# Patient Record
Sex: Male | Born: 1950 | Race: White | Hispanic: No | Marital: Married | Smoking: Former smoker
Health system: Southern US, Community
[De-identification: ages and names within clinical notes are randomized; demographics above are authoritative.]

## PROBLEM LIST (undated history)

## (undated) DIAGNOSIS — F341 Dysthymic disorder: Secondary | ICD-10-CM

## (undated) DIAGNOSIS — F909 Attention-deficit hyperactivity disorder, unspecified type: Secondary | ICD-10-CM

## (undated) DIAGNOSIS — Z8781 Personal history of (healed) traumatic fracture: Secondary | ICD-10-CM

## (undated) DIAGNOSIS — Z1211 Encounter for screening for malignant neoplasm of colon: Secondary | ICD-10-CM

## (undated) DIAGNOSIS — Z87820 Personal history of traumatic brain injury: Secondary | ICD-10-CM

## (undated) DIAGNOSIS — I498 Other specified cardiac arrhythmias: Secondary | ICD-10-CM

## (undated) DIAGNOSIS — M7120 Synovial cyst of popliteal space [Baker], unspecified knee: Secondary | ICD-10-CM

## (undated) DIAGNOSIS — E291 Testicular hypofunction: Secondary | ICD-10-CM

## (undated) DIAGNOSIS — H269 Unspecified cataract: Secondary | ICD-10-CM

## (undated) DIAGNOSIS — Z87891 Personal history of nicotine dependence: Secondary | ICD-10-CM

## (undated) DIAGNOSIS — R7303 Prediabetes: Secondary | ICD-10-CM

## (undated) DIAGNOSIS — M19011 Primary osteoarthritis, right shoulder: Secondary | ICD-10-CM

## (undated) DIAGNOSIS — G8929 Other chronic pain: Secondary | ICD-10-CM

## (undated) DIAGNOSIS — Z9289 Personal history of other medical treatment: Secondary | ICD-10-CM

## (undated) DIAGNOSIS — S92301A Fracture of unspecified metatarsal bone(s), right foot, initial encounter for closed fracture: Secondary | ICD-10-CM

## (undated) HISTORY — DX: Unspecified cataract: H26.9

## (undated) HISTORY — DX: Personal history of other medical treatment: Z92.89

## (undated) HISTORY — DX: Encounter for screening for malignant neoplasm of colon: Z12.11

## (undated) HISTORY — DX: Personal history of traumatic brain injury: Z87.820

## (undated) HISTORY — DX: Prediabetes: R73.03

## (undated) HISTORY — DX: Testicular hypofunction: E29.1

## (undated) HISTORY — DX: Attention-deficit hyperactivity disorder, unspecified type: F90.9

## (undated) HISTORY — DX: Other specified cardiac arrhythmias: I49.8

## (undated) HISTORY — DX: Personal history of nicotine dependence: Z87.891

## (undated) HISTORY — DX: Synovial cyst of popliteal space (Baker), unspecified knee: M71.20

## (undated) HISTORY — DX: Dysthymic disorder: F34.1

## (undated) HISTORY — DX: Fracture of unspecified metatarsal bone(s), right foot, initial encounter for closed fracture: S92.301A

---
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
## 2000-12-04 ENCOUNTER — Encounter: Payer: Self-pay | Admitting: Infectious Diseases

## 2000-12-04 ENCOUNTER — Ambulatory Visit (HOSPITAL_COMMUNITY): Admission: RE | Admit: 2000-12-04 | Discharge: 2000-12-04 | Payer: Self-pay | Admitting: Infectious Diseases

## 2002-02-04 ENCOUNTER — Ambulatory Visit (HOSPITAL_COMMUNITY): Admission: RE | Admit: 2002-02-04 | Discharge: 2002-02-04 | Payer: Self-pay | Admitting: General Surgery

## 2002-06-04 ENCOUNTER — Ambulatory Visit (HOSPITAL_COMMUNITY): Admission: RE | Admit: 2002-06-04 | Discharge: 2002-06-04 | Payer: Self-pay | Admitting: General Surgery

## 2003-12-14 ENCOUNTER — Ambulatory Visit (HOSPITAL_COMMUNITY): Admission: RE | Admit: 2003-12-14 | Discharge: 2003-12-14 | Payer: Self-pay | Admitting: General Surgery

## 2006-09-16 ENCOUNTER — Emergency Department (HOSPITAL_COMMUNITY): Admission: EM | Admit: 2006-09-16 | Discharge: 2006-09-16 | Payer: Self-pay | Admitting: Emergency Medicine

## 2008-04-23 HISTORY — PX: SEPTOPLASTY: SUR1290

## 2014-11-15 ENCOUNTER — Encounter: Payer: Self-pay | Admitting: Family Medicine

## 2014-11-15 ENCOUNTER — Ambulatory Visit (INDEPENDENT_AMBULATORY_CARE_PROVIDER_SITE_OTHER): Payer: Medicare Other | Admitting: Family Medicine

## 2014-11-15 VITALS — BP 130/85 | HR 71 | Temp 97.9°F | Resp 16 | Ht 69.5 in | Wt 165.0 lb

## 2014-11-15 DIAGNOSIS — F900 Attention-deficit hyperactivity disorder, predominantly inattentive type: Secondary | ICD-10-CM | POA: Diagnosis not present

## 2014-11-15 DIAGNOSIS — F909 Attention-deficit hyperactivity disorder, unspecified type: Secondary | ICD-10-CM

## 2014-11-15 MED ORDER — MUPIROCIN 2 % EX OINT: 1.0000 "application " | TOPICAL_OINTMENT | Freq: Three times a day (TID) | CUTANEOUS | Status: DC

## 2014-11-15 MED ORDER — AMPHETAMINE-DEXTROAMPHETAMINE 10 MG PO TABS: ORAL_TABLET | ORAL | Status: DC

## 2014-11-15 NOTE — Progress Notes (Signed)
Office Note 11/15/2014  CC:  Chief Complaint  Patient presents with  . Establish Care    needs refill on medication   HPI:  Clinton Adams is a 64 y.o. White male who is here to establish care. Patient's most recent primary MD:  PA at a [facility]" on [address] in [place]. Dr. Creta Levin at [facility] in [place]. Old records were not reviewed prior to or during today's visit.  Hx of adult ADD, dx'd approx 10 yrs ago but feels like he had classic sx's in HS. Ritalin gave him ANGER response. Adderall XR was then tried initially, then he was switched to IR and then titrated dose down to 10mg  : 1-2 IR tabs gets him through the day.  It helps him with focus, attention, eases tendency to get easily frustrated, makes his mood brighter in spite of bad days sometimes.    Past Medical History  Diagnosis Date  . Adult ADHD     Past Surgical History  Procedure Laterality Date  . Septoplasty  2010    deviated septum, per pt    Family History  Problem Relation Age of Onset  . Hypertension Mother   . Lung cancer Father     History   Social History  . Marital Status: Married    Spouse Name: N/A  . Number of Children: N/A  . Years of Education: N/A   Occupational History  . Not on file.   Social History Main Topics  . Smoking status: Former Smoker -- 1.00 packs/day for 30 years    Types: Cigarettes    Quit date: 04/23/1994  . Smokeless tobacco: Never Used  . Alcohol Use: Yes     Comment: daily  . Drug Use: No  . Sexual Activity: Not on file   Other Topics Concern  . Not on file   Social History Narrative   Married, one daughter.  No grandchilden.   Occup: Retired PA, CV surg/trauma surg/ortho, some FP.   Owns [employer] in [place], [place].   Former smoker; 30 pack-yr hx, quit age 23.  Alc:  1-2 hard liquor drinks a day.   Exercise: bicycle   Meds: adderall 10mg  IR, 1 tab bid  Not on File  ROS Review of Systems  Constitutional: Negative for fever and  fatigue.  HENT: Negative for congestion and sore throat.   Eyes: Negative for visual disturbance.  Respiratory: Negative for cough.   Cardiovascular: Negative for chest pain.  Gastrointestinal: Negative for nausea and abdominal pain.  Genitourinary: Negative for dysuria.  Musculoskeletal: Negative for back pain and joint swelling.  Skin: Negative for rash.  Neurological: Negative for weakness and headaches.  Hematological: Negative for adenopathy.    PE; Blood pressure 130/85, pulse 71, temperature 97.9 F (36.6 C), temperature source Oral, resp. rate 16, height 5' 9.5" (1.765 m), weight 165 lb (74.844 kg), SpO2 98 %. Wt Readings from Last 2 Encounters:  11/15/14 165 lb (74.844 kg)    Gen: alert, oriented x 4, affect pleasant.  Lucid thinking and conversation noted. HEENT: PERRLA, EOMI.   Neck: no LAD, mass, or thyromegaly. CV: RRR, no m/r/g LUNGS: CTA bilat, nonlabored. NEURO: no tremor or tics noted on observation.  Coordination intact. CN 2-12 grossly intact bilaterally, strength 5/5 in all extremeties.  No ataxia.  Pertinent labs:  none  ASSESSMENT AND PLAN:   New pt; no old records to obtain.  Adult ADHD.  The current medical regimen is effective;  continue present plan  and medications. I printed rx's for adderall IR , 1 bid, #60 today for this month, August, and September 2016.  Appropriate fill on/after date was noted on each rx.   At next f/u we'll have him sign standard drug contract as per protocol, possibly check UDS.  An After Visit Summary was printed and given to the patient.  Return in about 4 months (around 03/18/2015) for f/u adult add.

## 2014-11-15 NOTE — Progress Notes (Signed)
Pre visit review using our clinic review tool, if applicable. No additional management support is needed unless otherwise documented below in the visit note. 

## 2015-03-22 ENCOUNTER — Ambulatory Visit (INDEPENDENT_AMBULATORY_CARE_PROVIDER_SITE_OTHER): Payer: Medicare Other | Admitting: Family Medicine

## 2015-03-22 ENCOUNTER — Encounter: Payer: Self-pay | Admitting: Family Medicine

## 2015-03-22 VITALS — BP 131/86 | HR 70 | Temp 98.4°F | Resp 16 | Ht 69.5 in | Wt 167.0 lb

## 2015-03-22 DIAGNOSIS — R7611 Nonspecific reaction to tuberculin skin test without active tuberculosis: Secondary | ICD-10-CM | POA: Diagnosis not present

## 2015-03-22 DIAGNOSIS — F900 Attention-deficit hyperactivity disorder, predominantly inattentive type: Secondary | ICD-10-CM

## 2015-03-22 DIAGNOSIS — F909 Attention-deficit hyperactivity disorder, unspecified type: Secondary | ICD-10-CM

## 2015-03-22 MED ORDER — AMPHETAMINE-DEXTROAMPHETAMINE 10 MG PO TABS: ORAL_TABLET | ORAL | Status: DC

## 2015-03-22 NOTE — Patient Instructions (Signed)
Soak your toe in warm water with 1 capful of listerine, 4 capfuls of hydrogen peroxide, and 5 pumps of antibacterial soap: 20 min soak at least once a day.

## 2015-03-22 NOTE — Progress Notes (Signed)
Pre visit review using our clinic review tool, if applicable. No additional management support is needed unless otherwise documented below in the visit note. 

## 2015-03-22 NOTE — Progress Notes (Signed)
OFFICE VISIT  04/02/2015   CC:  Chief Complaint  Patient presents with  . Follow-up    Pt is not fasting.    HPI:    Patient is a 64 y.o. Caucasian male who presents for 4 mo f/u adult ADHD. Adderall helping with focus/concentration, no side effects, says he takes it daily.  States in 1974 he had an equivocal TB skin test and CXR at that time was normal and he was rx'd INH but admits he was partially noncompliant.  His routine after that was to get annual CXR but he hasn't had one in about 4 yrs. No repeat of TST has been done.  He has never had one of the quantiferon blood screening TB tests done. Denies cough, hemoptysis, chest pain, SOB, fevers, night sweats, or abnormal wt loss.  Past Medical History  Diagnosis Date  . Adult ADHD   . Former smoker   . 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  With his positive skin test he was treated for latent TB with INH but admits he wasn't fully compliant with the med (CXR pending as of 03/24/15).    Past Surgical History  Procedure Laterality Date  . Septoplasty  2010    deviated septum, per pt    Outpatient Prescriptions Prior to Visit  Medication Sig Dispense Refill  . amphetamine-dextroamphetamine (ADDERALL) 10 MG tablet 1 tab bid 60 tablet 0  . mupirocin ointment (BACTROBAN) 2 % Apply 1 application topically 3 (three) times daily. (Patient not taking: Reported on 03/22/2015) 15 g 2   No facility-administered medications prior to visit.    No Known Allergies  ROS As per HPI  PE: Blood pressure 131/86, pulse 70, temperature 98.4 F (36.9 C), temperature source Oral, resp. rate 16, height 5' 9.5" (1.765 m), weight 167 lb (75.751 kg), SpO2 97 %. Wt Readings from Last 2 Encounters:  03/22/15 167 lb (75.751 kg)  11/15/14 165 lb (74.844 kg)    Gen: alert, oriented x 4, affect pleasant.  Lucid thinking and conversation noted. HEENT: PERRLA, EOMI.   Neck: no LAD, mass, or thyromegaly. CV:  RRR, no m/r/g LUNGS: CTA bilat, nonlabored. NEURO: no tremor or tics noted on observation.  Coordination intact. CN 2-12 grossly intact bilaterally, strength 5/5 in all extremeties.  No ataxia.  LABS:  None today  IMPRESSION AND PLAN:  1) Adult ADHD; The current medical regimen is effective;  continue present plan and medications. I printed rx's for adderall 10mg  1 bid, #60 today for this month, Dec 2016, and Jan 2017.  Appropriate fill on/after date was noted on each rx.  2) Hx of latent TB; treated with INH but pt not fully compliant with treatment. Since pt claims his TB skin test at that time was "a little iffy", I'll do a quantiferon gold blood test today to screen him, will also do CXR today.  He declined flu and pneumonia vaccines today.  An After Visit Summary was printed and given to the patient.  FOLLOW UP: Return in about 4 months (around 07/20/2015) for Adult ADD.

## 2015-03-24 ENCOUNTER — Encounter: Payer: Self-pay | Admitting: Family Medicine

## 2015-03-24 LAB — QUANTIFERON TB GOLD ASSAY (BLOOD)
Interferon Gamma Release Assay: POSITIVE — AB
Mitogen value: >10 IU/mL
Quantiferon Nil Value: 0.02 IU/mL
Quantiferon Tb Ag Minus Nil Value: 1.51 IU/mL
TB Ag value: 1.53 IU/mL

## 2015-05-10 ENCOUNTER — Ambulatory Visit (HOSPITAL_BASED_OUTPATIENT_CLINIC_OR_DEPARTMENT_OTHER)
Admission: RE | Admit: 2015-05-10 | Discharge: 2015-05-10 | Disposition: A | Discharge status: Home / Self Care | Payer: Medicare Other | Source: Ambulatory Visit | Attending: Family Medicine | Admitting: Family Medicine

## 2015-05-10 DIAGNOSIS — R7611 Nonspecific reaction to tuberculin skin test without active tuberculosis: Secondary | ICD-10-CM | POA: Diagnosis not present

## 2015-05-10 DIAGNOSIS — R918 Other nonspecific abnormal finding of lung field: Secondary | ICD-10-CM | POA: Diagnosis not present

## 2015-05-10 HISTORY — DX: Personal history of (healed) traumatic fracture: Z87.81

## 2015-05-11 ENCOUNTER — Encounter (HOSPITAL_BASED_OUTPATIENT_CLINIC_OR_DEPARTMENT_OTHER): Payer: Self-pay

## 2015-05-11 ENCOUNTER — Other Ambulatory Visit: Payer: Self-pay | Admitting: Family Medicine

## 2015-05-11 DIAGNOSIS — S2232XA Fracture of one rib, left side, initial encounter for closed fracture: Secondary | ICD-10-CM

## 2015-05-11 DIAGNOSIS — J9811 Atelectasis: Secondary | ICD-10-CM

## 2015-08-04 ENCOUNTER — Encounter: Payer: Self-pay | Admitting: Family Medicine

## 2015-08-04 ENCOUNTER — Ambulatory Visit (INDEPENDENT_AMBULATORY_CARE_PROVIDER_SITE_OTHER): Payer: Medicare Other | Admitting: Family Medicine

## 2015-08-04 VITALS — BP 123/86 | HR 71 | Temp 97.8°F | Resp 16 | Ht 69.5 in | Wt 167.5 lb

## 2015-08-04 DIAGNOSIS — F900 Attention-deficit hyperactivity disorder, predominantly inattentive type: Secondary | ICD-10-CM

## 2015-08-04 DIAGNOSIS — F909 Attention-deficit hyperactivity disorder, unspecified type: Secondary | ICD-10-CM

## 2015-08-04 MED ORDER — AMPHETAMINE-DEXTROAMPHETAMINE 10 MG PO TABS: ORAL_TABLET | ORAL | Status: DC

## 2015-08-04 NOTE — Progress Notes (Signed)
OFFICE VISIT  08/04/2015   CC:  Chief Complaint  Patient presents with  . Follow-up    Pt is not fasting.    HPI:    Patient is a 65 y.o. Caucasian male who presents for f/u adult ADHD. Doing fine on adderall 10mg  bid.  No side effects. 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  Past Medical History  Diagnosis Date  . Adult ADHD   . Former smoker   . 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  With his positive skin test he was treated for latent TB with INH but admits he wasn't fully compliant with the med (CXR pending as of 03/24/15).  Marland Kitchen. History of rib fracture     Multiple on L side (remote past + 04/2015)    Past Surgical History  Procedure Laterality Date  . Septoplasty  2010    deviated septum, per pt    Outpatient Prescriptions Prior to Visit  Medication Sig Dispense Refill  . amphetamine-dextroamphetamine (ADDERALL) 10 MG tablet 1 tab bid 60 tablet 0   No facility-administered medications prior to visit.    No Known Allergies  ROS As per HPI  PE: Blood pressure 123/86, pulse 71, temperature 97.8 F (36.6 C), temperature source Oral, resp. rate 16, height 5' 9.5" (1.765 m), weight 167 lb 8 oz (75.978 kg), SpO2 96 %. Wt Readings from Last 2 Encounters:  08/04/15 167 lb 8 oz (75.978 kg)  03/22/15 167 lb (75.751 kg)    Gen: alert, oriented x 4, affect pleasant.  Lucid thinking and conversation noted. HEENT: PERRLA, EOMI.   Neck: no LAD, mass, or thyromegaly. CV: RRR, no m/r/g LUNGS: CTA bilat, nonlabored. NEURO: no tremor or tics noted on observation.  Coordination intact. CN 2-12 grossly intact bilaterally, strength 5/5 in all extremeties.  No ataxia.   LABS:  none  IMPRESSION AND PLAN:  Adult ADHD; The current medical regimen is effective;  continue present plan and medications. I printed rx's for adderall 10mg  bid, #60 today for this month, May 2017, and June 2017.  Appropriate fill on/after date was noted on each  rx.  We agreed to a 6 mo f/u interval this time---so he'll call for 3 more adderall rx's in 3 months. After that (in 6 mo), he'll return for fasting CPE.  An After Visit Summary was printed and given to the patient.  FOLLOW UP: Return in about 6 months (around 02/03/2016) for annual CPE (fasting).  Signed:  Santiago BumpersPhil Avanelle Pixley, MD           08/04/2015

## 2015-08-04 NOTE — Progress Notes (Signed)
Pre visit review using our clinic review tool, if applicable. No additional management support is needed unless otherwise documented below in the visit note. 

## 2015-08-15 ENCOUNTER — Telehealth: Payer: Self-pay | Admitting: Family Medicine

## 2015-08-15 MED ORDER — MEFENAMIC ACID 250 MG PO CAPS: ORAL_CAPSULE | ORAL | Status: DC

## 2015-08-15 NOTE — Telephone Encounter (Signed)
Ponstel eRx'd to pharmacy as per pt request.

## 2015-08-15 NOTE — Telephone Encounter (Signed)
Caller name:[place]  Relationship to patient:self Can be reached:[phone] Pharmacy:[facility]   Reason for call:Would like an rx for ponstel to [facility]

## 2015-08-15 NOTE — Telephone Encounter (Signed)
Pls ask what pain the patient is using this med to treat and ask if a more traditional NSAID (like diclofenac or meloxicam) would be ok with him b/c I have never used ponstel, esp without knowing the patient's kidney function first.  Let me know-thx

## 2015-08-15 NOTE — Telephone Encounter (Signed)
Please advise. Medication requested not on pt medication list.

## 2015-08-15 NOTE — Telephone Encounter (Signed)
Pt advised and voiced understanding.   

## 2015-08-15 NOTE — Telephone Encounter (Signed)
Spoke to pt and he stated that he has been using this for general aches and pains. He stated that he does not want to try diclofenac or meloxicam. He stated that he has done so reading on the ponstel and it also helps pt with alzheimers/memory problems.

## 2015-11-21 ENCOUNTER — Telehealth: Payer: Self-pay | Admitting: Family Medicine

## 2015-11-21 NOTE — Telephone Encounter (Signed)
Patient requesting refill of the following:  1. amphetamine-dextroamphetamine (ADDERALL) 10 MG tablet  2. Mefenamic Acid 250 MG CAPS  Please call when ready for pick up.   Preferred Pharmacy [facility] [id number], [place] - [address] [phone] (Phone)

## 2015-11-22 MED ORDER — AMPHETAMINE-DEXTROAMPHETAMINE 10 MG PO TABS: ORAL_TABLET | ORAL | 0 refills | Status: DC

## 2015-11-22 MED ORDER — MEFENAMIC ACID 250 MG PO CAPS: ORAL_CAPSULE | ORAL | 3 refills | Status: DC

## 2015-11-22 NOTE — Telephone Encounter (Signed)
Rx's done. He'll need a f/u visit in 3 months.-thx

## 2015-11-23 NOTE — Telephone Encounter (Signed)
Pt advised and voiced understanding.  Rx put up front for p/u.  

## 2015-12-09 DIAGNOSIS — H524 Presbyopia: Secondary | ICD-10-CM | POA: Diagnosis not present

## 2015-12-09 DIAGNOSIS — H5203 Hypermetropia, bilateral: Secondary | ICD-10-CM | POA: Diagnosis not present

## 2015-12-09 DIAGNOSIS — H52223 Regular astigmatism, bilateral: Secondary | ICD-10-CM | POA: Diagnosis not present

## 2016-03-08 ENCOUNTER — Ambulatory Visit (INDEPENDENT_AMBULATORY_CARE_PROVIDER_SITE_OTHER): Payer: Medicare Other | Admitting: Family Medicine

## 2016-03-08 ENCOUNTER — Encounter: Payer: Self-pay | Admitting: Family Medicine

## 2016-03-08 VITALS — BP 129/80 | HR 67 | Temp 97.8°F | Resp 16 | Wt 166.8 lb

## 2016-03-08 DIAGNOSIS — F909 Attention-deficit hyperactivity disorder, unspecified type: Secondary | ICD-10-CM

## 2016-03-08 DIAGNOSIS — M25519 Pain in unspecified shoulder: Secondary | ICD-10-CM

## 2016-03-08 MED ORDER — AMPHETAMINE-DEXTROAMPHETAMINE 10 MG PO TABS: ORAL_TABLET | ORAL | 0 refills | Status: DC

## 2016-03-08 MED ORDER — IBUPROFEN 800 MG PO TABS: 800.0000 mg | ORAL_TABLET | Freq: Three times a day (TID) | ORAL | 0 refills | Status: DC | PRN

## 2016-03-08 NOTE — Progress Notes (Signed)
OFFICE VISIT  03/08/2016   CC:  Chief Complaint  Patient presents with  . Follow-up    ADHD   HPI:    Patient is a 65 y.o. Caucasian male who presents for f/u adult ADHD. Adderall 10mg  bid helping well with concentration/focus/motivation.  Takes NSAIDs, takes about 90 per year, bilat shoulder pain, worse with cold weather.   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  Taking the anti-inflammatory helps at night significantly.   He wants to switch to ibuprofen 800 mg from his current NSAID.  He is not interested in imaging, injection, or PT or referral. He does not want his renal function checked today.    Past Medical History:  Diagnosis Date  . Adult ADHD   . Former smoker   . 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  With his positive skin test he was treated for latent TB with INH but admits he wasn't fully compliant with the med (CXR pending as of 03/24/15).  Marland Kitchen. History of rib fracture    Multiple on L side (remote past + 04/2015)    Past Surgical History:  Procedure Laterality Date  . SEPTOPLASTY  2010   deviated septum, per pt    Outpatient Medications Prior to Visit  Medication Sig Dispense Refill  . amphetamine-dextroamphetamine (ADDERALL) 10 MG tablet 1 tab bid 60 tablet 0  . Mefenamic Acid 250 MG CAPS 1 tab po q6h prn pain.  Do not take consecutive doses for more than 7 days.  Wait 7 days before restarting. 28 each 3   No facility-administered medications prior to visit.     No Known Allergies  ROS As per HPI  PE: Blood pressure 129/80, pulse 67, temperature 97.8 F (36.6 C), temperature source Temporal, resp. rate 16, weight 166 lb 12.8 oz (75.7 kg), SpO2 98 %. Wt Readings from Last 2 Encounters:  03/08/16 166 lb 12.8 oz (75.7 kg)  08/04/15 167 lb 8 oz (76 kg)    Gen: alert, oriented x 4, affect pleasant.  Lucid thinking and conversation noted. HEENT: PERRLA, EOMI.   Neck: no LAD, mass, or  thyromegaly. CV: RRR, no m/r/g LUNGS: CTA bilat, nonlabored. NEURO: no tremor or tics noted on observation.  Coordination intact. CN 2-12 grossly intact bilaterally, strength 5/5 in all extremeties.  No ataxia.  LABS:   NONE   IMPRESSION AND PLAN:  1) Adult ADHD; The current medical regimen is effective;  continue present plan and medications. I printed rx's for adderall 10mg  bid, #60 today for the next 3 months.  Appropriate fill on/after date was noted on each rx.  2) Chronic bilat shoulder arthralgias: takes NSAID on fairly regular basis---usually a night time dose an average of 90 days per year.  He wants to switch from his current med (mefenamic acid) to ibuprofen 800mg , so I rx'd this for him today.  We have discussed need for renal function testing and he understands and wants to defer this until next f/u in 636mo---fasting CPE at that time.  An After Visit Summary was printed and given to the patient.  FOLLOW UP: Return in about 6 months (around 09/05/2016) for annual CPE (fasting).  Signed:  Santiago BumpersPhil Syreeta Figler, MD           03/08/2016

## 2016-03-08 NOTE — Progress Notes (Signed)
Pre visit review using our clinic review tool, if applicable. No additional management support is needed unless otherwise documented below in the visit note. 

## 2016-03-23 DIAGNOSIS — Z1211 Encounter for screening for malignant neoplasm of colon: Secondary | ICD-10-CM

## 2016-03-23 HISTORY — DX: Encounter for screening for malignant neoplasm of colon: Z12.11

## 2016-03-30 ENCOUNTER — Ambulatory Visit (INDEPENDENT_AMBULATORY_CARE_PROVIDER_SITE_OTHER): Payer: Medicare Other

## 2016-03-30 VITALS — BP 138/78 | HR 77 | Ht 70.5 in | Wt 171.4 lb

## 2016-03-30 DIAGNOSIS — Z0000 Encounter for general adult medical examination without abnormal findings: Secondary | ICD-10-CM

## 2016-03-30 NOTE — Patient Instructions (Addendum)
Eat heart healthy diet (full of fruits, vegetables, whole grains, lean protein, water--limit salt, fat, and sugar intake) and increase physical activity as tolerated.  Continue doing brain stimulating activities (puzzles, reading, adult coloring books, staying active) to keep memory sharp.   Complete Cologuard.    Fall Prevention in the Home Introduction Falls can cause injuries. They can happen to people of all ages. There are many things you can do to make your home safe and to help prevent falls. What can I do on the outside of my home?  Regularly fix the edges of walkways and driveways and fix any cracks.  Remove anything that might make you trip as you walk through a door, such as a raised step or threshold.  Trim any bushes or trees on the path to your home.  Use bright outdoor lighting.  Clear any walking paths of anything that might make someone trip, such as rocks or tools.  Regularly check to see if handrails are loose or broken. Make sure that both sides of any steps have handrails.  Any raised decks and porches should have guardrails on the edges.  Have any leaves, snow, or ice cleared regularly.  Use sand or salt on walking paths during winter.  Clean up any spills in your garage right away. This includes oil or grease spills. What can I do in the bathroom?  Use night lights.  Install grab bars by the toilet and in the tub and shower. Do not use towel bars as grab bars.  Use non-skid mats or decals in the tub or shower.  If you need to sit down in the shower, use a plastic, non-slip stool.  Keep the floor dry. Clean up any water that spills on the floor as soon as it happens.  Remove soap buildup in the tub or shower regularly.  Attach bath mats securely with double-sided non-slip rug tape.  Do not have throw rugs and other things on the floor that can make you trip. What can I do in the bedroom?  Use night lights.  Make sure that you have a light by  your bed that is easy to reach.  Do not use any sheets or blankets that are too big for your bed. They should not hang down onto the floor.  Have a firm chair that has side arms. You can use this for support while you get dressed.  Do not have throw rugs and other things on the floor that can make you trip. What can I do in the kitchen?  Clean up any spills right away.  Avoid walking on wet floors.  Keep items that you use a lot in easy-to-reach places.  If you need to reach something above you, use a strong step stool that has a grab bar.  Keep electrical cords out of the way.  Do not use floor polish or wax that makes floors slippery. If you must use wax, use non-skid floor wax.  Do not have throw rugs and other things on the floor that can make you trip. What can I do with my stairs?  Do not leave any items on the stairs.  Make sure that there are handrails on both sides of the stairs and use them. Fix handrails that are broken or loose. Make sure that handrails are as long as the stairways.  Check any carpeting to make sure that it is firmly attached to the stairs. Fix any carpet that is loose or worn.  Avoid  having throw rugs at the top or bottom of the stairs. If you do have throw rugs, attach them to the floor with carpet tape.  Make sure that you have a light switch at the top of the stairs and the bottom of the stairs. If you do not have them, ask someone to add them for you. What else can I do to help prevent falls?  Wear shoes that:  Do not have high heels.  Have rubber bottoms.  Are comfortable and fit you well.  Are closed at the toe. Do not wear sandals.  If you use a stepladder:  Make sure that it is fully opened. Do not climb a closed stepladder.  Make sure that both sides of the stepladder are locked into place.  Ask someone to hold it for you, if possible.  Clearly mark and make sure that you can see:  Any grab bars or handrails.  First and  last steps.  Where the edge of each step is.  Use tools that help you move around (mobility aids) if they are needed. These include:  Canes.  Walkers.  Scooters.  Crutches.  Turn on the lights when you go into a dark area. Replace any light bulbs as soon as they burn out.  Set up your furniture so you have a clear path. Avoid moving your furniture around.  If any of your floors are uneven, fix them.  If there are any pets around you, be aware of where they are.  Review your medicines with your doctor. Some medicines can make you feel dizzy. This can increase your chance of falling. Ask your doctor what other things that you can do to help prevent falls. This information is not intended to replace advice given to you by your health care provider. Make sure you discuss any questions you have with your health care provider. Document Released: 02/03/2009 Document Revised: 09/15/2015 Document Reviewed: 05/14/2014  2017 Elsevier  Health Maintenance, Male A healthy lifestyle and preventative care can promote health and wellness.  Maintain regular health, dental, and eye exams.  Eat a healthy diet. Foods like vegetables, fruits, whole grains, low-fat dairy products, and lean protein foods contain the nutrients you need and are low in calories. Decrease your intake of foods high in solid fats, added sugars, and salt. Get information about a proper diet from your health care provider, if necessary.  Regular physical exercise is one of the most important things you can do for your health. Most adults should get at least 150 minutes of moderate-intensity exercise (any activity that increases your heart rate and causes you to sweat) each week. In addition, most adults need muscle-strengthening exercises on 2 or more days a week.   Maintain a healthy weight. The body mass index (BMI) is a screening tool to identify possible weight problems. It provides an estimate of body fat based on height  and weight. Your health care provider can find your BMI and can help you achieve or maintain a healthy weight. For males 20 years and older:  A BMI below 18.5 is considered underweight.  A BMI of 18.5 to 24.9 is normal.  A BMI of 25 to 29.9 is considered overweight.  A BMI of 30 and above is considered obese.  Maintain normal blood lipids and cholesterol by exercising and minimizing your intake of saturated fat. Eat a balanced diet with plenty of fruits and vegetables. Blood tests for lipids and cholesterol should begin at age 69 and be  repeated every 5 years. If your lipid or cholesterol levels are high, you are over age 40, or you are at high risk for heart disease, you may need your cholesterol levels checked more frequently.Ongoing high lipid and cholesterol levels should be treated with medicines if diet and exercise are not working.  If you smoke, find out from your health care provider how to quit. If you do not use tobacco, do not start.  Lung cancer screening is recommended for adults aged 55-80 years who are at high risk for developing lung cancer because of a history of smoking. A yearly low-dose CT scan of the lungs is recommended for people who have at least a 30-pack-year history of smoking and are current smokers or have quit within the past 15 years. A pack year of smoking is smoking an average of 1 pack of cigarettes a day for 1 year (for example, a 30-pack-year history of smoking could mean smoking 1 pack a day for 30 years or 2 packs a day for 15 years). Yearly screening should continue until the smoker has stopped smoking for at least 15 years. Yearly screening should be stopped for people who develop a health problem that would prevent them from having lung cancer treatment.  If you choose to drink alcohol, do not have more than 2 drinks per day. One drink is considered to be 12 oz (360 mL) of beer, 5 oz (150 mL) of wine, or 1.5 oz (45 mL) of liquor.  Avoid the use of street  drugs. Do not share needles with anyone. Ask for help if you need support or instructions about stopping the use of drugs.  High blood pressure causes heart disease and increases the risk of stroke. High blood pressure is more likely to develop in:  People who have blood pressure in the end of the normal range (100-139/85-89 mm Hg).  People who are overweight or obese.  People who are African American.  If you are 68-36 years of age, have your blood pressure checked every 3-5 years. If you are 78 years of age or older, have your blood pressure checked every year. You should have your blood pressure measured twice-once when you are at a hospital or clinic, and once when you are not at a hospital or clinic. Record the average of the two measurements. To check your blood pressure when you are not at a hospital or clinic, you can use:  An automated blood pressure machine at a pharmacy.  A home blood pressure monitor.  If you are 40-56 years old, ask your health care provider if you should take aspirin to prevent heart disease.  Diabetes screening involves taking a blood sample to check your fasting blood sugar level. This should be done once every 3 years after age 14 if you are at a normal weight and without risk factors for diabetes. Testing should be considered at a younger age or be carried out more frequently if you are overweight and have at least 1 risk factor for diabetes.  Colorectal cancer can be detected and often prevented. Most routine colorectal cancer screening begins at the age of 72 and continues through age 77. However, your health care provider may recommend screening at an earlier age if you have risk factors for colon cancer. On a yearly basis, your health care provider may provide home test kits to check for hidden blood in the stool. A small camera at the end of a tube may be used to directly  examine the colon (sigmoidoscopy or colonoscopy) to detect the earliest forms of  colorectal cancer. Talk to your health care provider about this at age 3 when routine screening begins. A direct exam of the colon should be repeated every 5-10 years through age 51, unless early forms of precancerous polyps or small growths are found.  People who are at an increased risk for hepatitis B should be screened for this virus. You are considered at high risk for hepatitis B if:  You were born in a country where hepatitis B occurs often. Talk with your health care provider about which countries are considered high risk.  Your parents were born in a high-risk country and you have not received a shot to protect against hepatitis B (hepatitis B vaccine).  You have HIV or AIDS.  You use needles to inject street drugs.  You live with, or have sex with, someone who has hepatitis B.  You are a man who has sex with other men (MSM).  You get hemodialysis treatment.  You take certain medicines for conditions like cancer, organ transplantation, and autoimmune conditions.  Hepatitis C blood testing is recommended for all people born from [birth year] through 1966 and any individual with known risk factors for hepatitis C.  Healthy men should no longer receive prostate-specific antigen (PSA) blood tests as part of routine cancer screening. Talk to your health care provider about prostate cancer screening.  Testicular cancer screening is not recommended for adolescents or adult males who have no symptoms. Screening includes self-exam, a health care provider exam, and other screening tests. Consult with your health care provider about any symptoms you have or any concerns you have about testicular cancer.  Practice safe sex. Use condoms and avoid high-risk sexual practices to reduce the spread of sexually transmitted infections (STIs).  You should be screened for STIs, including gonorrhea and chlamydia if:  You are sexually active and are younger than 24 years.  You are older than 24 years, and  your health care provider tells you that you are at risk for this type of infection.  Your sexual activity has changed since you were last screened, and you are at an increased risk for chlamydia or gonorrhea. Ask your health care provider if you are at risk.  If you are at risk of being infected with HIV, it is recommended that you take a prescription medicine daily to prevent HIV infection. This is called pre-exposure prophylaxis (PrEP). You are considered at risk if:  You are a man who has sex with other men (MSM).  You are a heterosexual man who is sexually active with multiple partners.  You take drugs by injection.  You are sexually active with a partner who has HIV.  Talk with your health care provider about whether you are at high risk of being infected with HIV. If you choose to begin PrEP, you should first be tested for HIV. You should then be tested every 3 months for as long as you are taking PrEP.  Use sunscreen. Apply sunscreen liberally and repeatedly throughout the day. You should seek shade when your shadow is shorter than you. Protect yourself by wearing long sleeves, pants, a wide-brimmed hat, and sunglasses year round whenever you are outdoors.  Tell your health care provider of new moles or changes in moles, especially if there is a change in shape or color. Also, tell your health care provider if a mole is larger than the size of a pencil eraser.  A  one-time screening for abdominal aortic aneurysm (AAA) and surgical repair of large AAAs by ultrasound is recommended for men aged 65-75 years who are current or former smokers.  Stay current with your vaccines (immunizations). This information is not intended to replace advice given to you by your health care provider. Make sure you discuss any questions you have with your health care provider. Document Released: 10/06/2007 Document Revised: 04/30/2014 Document Reviewed: 01/11/2015 Elsevier Interactive Patient Education   2017 [employer]

## 2016-03-30 NOTE — Progress Notes (Addendum)
Subjective:   Clinton Adams is a 65 y.o. male who presents for an Initial Medicare Annual Wellness Visit.  The Patient was informed that the wellness visit is to identify future health risk and educate and initiate measures that can reduce risk for increased disease through the lifespan.   Describes health as fair, good or great? 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 Enjoys reading.   Review of Systems  No ROS.  Medicare Wellness Visit.  Cardiac Risk Factors include: advanced age (>6755men, 30>65 women);male gender   Sleep patterns: no sleep issues.   Home Safety/Smoke Alarms: Smoke detectors in place Living environment; residence and Firearm Safety: 2-story house with wife, daughter (66 yo) and dog.Firearms locked away.  Seat Belt Safety/Bike Helmet: Wears seat belt   Counseling:   Eye Exam-Last exam October 2017, yearly by [facility].  Dental-Last exam > 2 years, Dr. Jon BillingsMorrison.   Male:   CCS-None. Cologuard ordered.     PSA-2 years ago, patient reports normal.     Objective:    Today's Vitals   03/30/16 1305  BP: 138/78  Pulse: 77  SpO2: 97%  Weight: 171 lb 6.4 oz (77.7 kg)  Height: 5' 10.5" (1.791 m)  PainSc: 2    Body mass index is 24.25 kg/m.  Current Medications (verified) Outpatient Encounter Prescriptions as of 03/30/2016  Medication Sig  . amphetamine-dextroamphetamine (ADDERALL) 10 MG tablet 1 tab bid  . Ascorbic Acid (VITAMIN C) 100 MG tablet Take 100 mg by mouth daily.  Marland Kitchen. b complex vitamins tablet Take 1 tablet by mouth daily.  . cholecalciferol (VITAMIN D) 1000 units tablet Take 1,000 Units by mouth daily.  Marland Kitchen. co-enzyme Q-10 30 MG capsule Take 30 mg by mouth 3 (three) times daily.  Marland Kitchen. ibuprofen (ADVIL,MOTRIN) 800 MG tablet Take 1 tablet (800 mg total) by mouth every 8 (eight) hours as needed.  Marland Kitchen. KRILL OIL OMEGA-3 PO Take by mouth.  . Nutritional Supplements (DHEA PO) Take by mouth.  . vitamin B-12 (CYANOCOBALAMIN) 100  MCG tablet Take 100 mcg by mouth daily.   No facility-administered encounter medications on file as of 03/30/2016.     Allergies (verified) Patient has no known allergies.   History: Past Medical History:  Diagnosis Date  . Adult ADHD   . Former smoker   . 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  With his positive skin test he was treated for latent TB with INH but admits he wasn't fully compliant with the med (CXR pending as of 03/24/15).  Marland Kitchen. History of rib fracture    Multiple on L side (remote past + 04/2015)   Past Surgical History:  Procedure Laterality Date  . SEPTOPLASTY  2010   deviated septum, per pt   Family History  Problem Relation Age of Onset  . Hypertension Mother   . Lung cancer Father    Social History   Occupational History  . Not on file.   Social History Main Topics  . Smoking status: Former Smoker    Packs/day: 1.00    Years: 30.00    Types: Cigarettes    Quit date: 04/23/1994  . Smokeless tobacco: Never Used  . Alcohol use Yes     Comment: daily  . Drug use: No  . Sexual activity: Not on file   Tobacco Counseling Counseling given: Not Answered   Activities of Daily Living In your present state of health, do you have any difficulty  performing the following activities: 03/30/2016  Hearing? N  Vision? N  Difficulty concentrating or making decisions? N  Walking or climbing stairs? N  Dressing or bathing? N  Doing errands, shopping? N  Preparing Food and eating ? N  Using the Toilet? N  In the past six months, have you accidently leaked urine? N  Do you have problems with loss of bowel control? N  Managing your Medications? N  Managing your Finances? N  Housekeeping or managing your Housekeeping? N  Some recent data might be hidden    Immunizations and Health Maintenance  There is no immunization history on file for this patient. Health Maintenance Due  Topic Date Due  . Hepatitis C Screening   01-25-1951  . TETANUS/TDAP  12/15/1968  . COLONOSCOPY  12/16/1999  . ZOSTAVAX  12/15/2009  . PNA vac Low Risk Adult (1 of 2 - PCV13) 12/16/2014  . INFLUENZA VACCINE  11/22/2015    Patient Care Team: Jeoffrey Massed, MD as PCP - General (Family Medicine)  Indicate any recent Medical Services you may have received from other than [facility] providers in the past year (date may be approximate).    Assessment:   This is a routine wellness examination for [name]. Physical assessment deferred to PCP.   Hearing/Vision screen Hearing Screening Comments: Able to hear conversational tones w/o difficulty. No issues reported.   Vision Screening Comments: Wears glasses to 20/20 vision.   Dietary issues and exercise activities discussed: Current Exercise Habits: Home exercise routine, Type of exercise: walking;Other - see comments (biking), Time (Minutes): 45, Frequency (Times/Week): 2, Weekly Exercise (Minutes/Week): 90, Intensity: Moderate   Diet (meal preparation, eat out, water intake, caffeinated beverages, dairy products, fruits and vegetables):  Eats at home. Drinks little throughout the day, mostly Coke. Snacks on "little debbie snack cake".   Breakfast: toast, bacon and coffee Lunch: skips (eats late breakfast) Dinner: Meats, limited vegetables     Discussed increasing water, fruit and vegetable intake.  Encouraged to increase activity.  Patient reports being too busy to go to gym and has no desire to work out at home.   Goals    . Weight (lb) < 155 lb (70.3 kg)          Lose weight with lower calore intake.       Depression Screen PHQ 2/9 Scores 03/30/2016 08/04/2015  PHQ - 2 Score 6 0  PHQ- 9 Score 7 -    Patient states Adderall helps with depression. Declines further treatment. Denies thoughts of self harm.   Fall Risk Fall Risk  03/30/2016 08/04/2015  Falls in the past year? Yes Yes  Number falls in past yr: 1 1  Injury with Fall? Yes Yes  Follow up Falls prevention  discussed -    Cognitive Function:       Ad8 score reviewed for issues:  Issues making decisions:no  Less interest in hobbies / activities:no  Repeats questions, stories (family complaining):no  Trouble using ordinary gadgets (microwave, computer, phone):no  Forgets the month or year: no  Mismanaging finances: no  Remembering appts:no  Daily problems with thinking and/or memory:no Ad8 score is=0     Screening Tests Health Maintenance  Topic Date Due  . Hepatitis C Screening  June 07, 1950  . TETANUS/TDAP  12/15/1968  . COLONOSCOPY  12/16/1999  . ZOSTAVAX  12/15/2009  . PNA vac Low Risk Adult (1 of 2 - PCV13) 12/16/2014  . INFLUENZA VACCINE  11/22/2015        Plan:  Eat heart healthy diet (full of fruits, vegetables, whole grains, lean protein, water--limit salt, fat, and sugar intake) and increase physical activity as tolerated.  Continue doing brain stimulating activities (puzzles, reading, adult coloring books, staying active) to keep memory sharp.   Complete Cologuard.    During the course of the visit Stann MainlandClancy was educated and counseled about the following appropriate screening and preventive services:   Vaccines to include Pneumoccal, Influenza, Hepatitis B, Td, Zostavax, HCV  Colorectal cancer screening  Cardiovascular disease screening  Diabetes screening  Glaucoma screening  Nutrition counseling  Prostate cancer screening   Patient Instructions (the written plan) were given to the patient.   Alysia PennaKimberly R Kashina Mecum, RN   03/30/2016     Medical screening examination/treatment/procedure(s) were performed by non-physician practitioner and as supervising physician I was immediately available for consultation/collaboration.  I agree with above assessment and plan.  Electronically Signed by: Felix Pacinienee Kuneff, DO [facility]- OR

## 2016-03-30 NOTE — Progress Notes (Signed)
Pre visit review using our clinic review tool, if applicable. No additional management support is needed unless otherwise documented below in the visit note. 

## 2016-04-01 NOTE — Progress Notes (Signed)
AWV reviewed and agree.  Signed:  Santiago BumpersPhil McGowen, MD           04/01/2016

## 2016-04-04 ENCOUNTER — Encounter: Payer: Self-pay | Admitting: Family Medicine

## 2016-04-04 DIAGNOSIS — Z1211 Encounter for screening for malignant neoplasm of colon: Secondary | ICD-10-CM | POA: Diagnosis not present

## 2016-04-04 DIAGNOSIS — Z1212 Encounter for screening for malignant neoplasm of rectum: Secondary | ICD-10-CM | POA: Diagnosis not present

## 2016-04-04 LAB — COLOGUARD

## 2016-04-12 LAB — HM COLONOSCOPY

## 2016-05-22 ENCOUNTER — Other Ambulatory Visit: Payer: Self-pay | Admitting: Family Medicine

## 2016-05-22 MED ORDER — CIPROFLOXACIN HCL 750 MG PO TABS: 750.0000 mg | ORAL_TABLET | Freq: Two times a day (BID) | ORAL | 0 refills | Status: AC

## 2016-08-27 ENCOUNTER — Encounter: Payer: Self-pay | Admitting: *Deleted

## 2016-08-27 ENCOUNTER — Ambulatory Visit: Payer: Medicare Other | Admitting: Family Medicine

## 2016-08-27 DIAGNOSIS — F909 Attention-deficit hyperactivity disorder, unspecified type: Secondary | ICD-10-CM | POA: Insufficient documentation

## 2016-08-27 NOTE — Progress Notes (Deleted)
OFFICE VISIT  08/27/2016   CC: No chief complaint on file.  HPI:    Patient is a 66 y.o. Caucasian male who presents for 6 mo f/u adult ADD. Historically he has done consistently well on adderall 10mg  bid, which is what we continued at last f/u visit.  Has hx of relatively frequent NSAID use for bilat shoulder arthralgias.  Plan was to check BMET today.  Past Medical History:  Diagnosis Date  . Adult ADHD   . Former smoker   . 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  With his positive skin test he was treated for latent TB with INH but admits he wasn't fully compliant with the med (CXR pending as of 03/24/15).  Marland Kitchen. History of rib fracture    Multiple on L side (remote past + 04/2015)    Past Surgical History:  Procedure Laterality Date  . SEPTOPLASTY  2010   deviated septum, per pt    Outpatient Medications Prior to Visit  Medication Sig Dispense Refill  . amphetamine-dextroamphetamine (ADDERALL) 10 MG tablet 1 tab bid 60 tablet 0  . Ascorbic Acid (VITAMIN C) 100 MG tablet Take 100 mg by mouth daily.    Marland Kitchen. b complex vitamins tablet Take 1 tablet by mouth daily.    . cholecalciferol (VITAMIN D) 1000 units tablet Take 1,000 Units by mouth daily.    Marland Kitchen. co-enzyme Q-10 30 MG capsule Take 30 mg by mouth 3 (three) times daily.    Marland Kitchen. ibuprofen (ADVIL,MOTRIN) 800 MG tablet Take 1 tablet (800 mg total) by mouth every 8 (eight) hours as needed. 90 tablet 0  . KRILL OIL OMEGA-3 PO Take by mouth.    . Nutritional Supplements (DHEA PO) Take by mouth.    . vitamin B-12 (CYANOCOBALAMIN) 100 MCG tablet Take 100 mcg by mouth daily.     No facility-administered medications prior to visit.     No Known Allergies  ROS As per HPI  PE: There were no vitals taken for this visit. ***  LABS:  ***  IMPRESSION AND PLAN:  No problem-specific Assessment & Plan notes found for this encounter.  ? BMET?  An After Visit Summary was printed and given to the  patient.  FOLLOW UP: No Follow-up on file.  Signed:  Santiago BumpersPhil Aki Abalos, MD           08/27/2016

## 2016-08-29 ENCOUNTER — Encounter: Payer: Self-pay | Admitting: Family Medicine

## 2016-08-29 ENCOUNTER — Ambulatory Visit (INDEPENDENT_AMBULATORY_CARE_PROVIDER_SITE_OTHER): Payer: Medicare Other | Admitting: Family Medicine

## 2016-08-29 VITALS — BP 133/81 | HR 66 | Temp 98.0°F | Resp 16 | Ht 69.5 in | Wt 168.2 lb

## 2016-08-29 DIAGNOSIS — F988 Other specified behavioral and emotional disorders with onset usually occurring in childhood and adolescence: Secondary | ICD-10-CM | POA: Diagnosis not present

## 2016-08-29 MED ORDER — AMPHETAMINE-DEXTROAMPHETAMINE 10 MG PO TABS: ORAL_TABLET | ORAL | 0 refills | Status: DC

## 2016-08-29 NOTE — Progress Notes (Signed)
OFFICE VISIT  08/29/2016   CC:  Chief Complaint  Patient presents with  . Follow-up    RCI, pt is not fasting.    HPI:    Patient is a 66 y.o. Caucasian male who presents for 6 mo f/u adult ADD. Has been stable historically on adderall IR 10mg  bid.  Doing well with concentration, focus, motivation.  No adverse side effects. Very busy with running [employer].  Past Medical History:  Diagnosis Date  . Adult ADHD   . Former smoker   . 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  With his positive skin test he was treated for latent TB with INH but admits he wasn't fully compliant with the med (CXR pending as of 03/24/15).  Marland Kitchen. History of rib fracture    Multiple on L side (remote past + 04/2015)    Past Surgical History:  Procedure Laterality Date  . SEPTOPLASTY  2010   deviated septum, per pt    Outpatient Medications Prior to Visit  Medication Sig Dispense Refill  . Ascorbic Acid (VITAMIN C) 100 MG tablet Take 100 mg by mouth daily.    . cholecalciferol (VITAMIN D) 1000 units tablet Take 1,000 Units by mouth daily.    Marland Kitchen. ibuprofen (ADVIL,MOTRIN) 800 MG tablet Take 1 tablet (800 mg total) by mouth every 8 (eight) hours as needed. 90 tablet 0  . KRILL OIL OMEGA-3 PO Take by mouth.    Marland Kitchen. amphetamine-dextroamphetamine (ADDERALL) 10 MG tablet 1 tab bid 60 tablet 0  . b complex vitamins tablet Take 1 tablet by mouth daily.    Marland Kitchen. co-enzyme Q-10 30 MG capsule Take 30 mg by mouth 3 (three) times daily.    . Nutritional Supplements (DHEA PO) Take by mouth.    . vitamin B-12 (CYANOCOBALAMIN) 100 MCG tablet Take 100 mcg by mouth daily.     No facility-administered medications prior to visit.     No Known Allergies  ROS As per HPI  PE: Blood pressure 133/81, pulse 66, temperature 98 F (36.7 C), temperature source Oral, resp. rate 16, height 5' 9.5" (1.765 m), weight 168 lb 4 oz (76.3 kg), SpO2 96 %. Wt Readings from Last 2 Encounters:  08/29/16 168 lb  4 oz (76.3 kg)  03/30/16 171 lb 6.4 oz (77.7 kg)    Gen: alert, oriented x 4, affect pleasant.  Lucid thinking and conversation noted. HEENT: PERRLA, EOMI.   Neck: no LAD, mass, or thyromegaly. CV: RRR, no m/r/g LUNGS: CTA bilat, nonlabored. NEURO: no tremor or tics noted on observation.  Coordination intact. CN 2-12 grossly intact bilaterally, strength 5/5 in all extremeties.  No ataxia.   LABS:  none  IMPRESSION AND PLAN:  Adult ADD; The current medical regimen is effective;  continue present plan and medications. I printed rx's for adderall IR 10mg , 1 bid, #60 today for this month, June 2018, and July 2018.  Appropriate fill on/after date was noted on each rx.  An After Visit Summary was printed and given to the patient.  FOLLOW UP: Return in about 6 months (around 03/01/2017) for routine chronic illness f/u. He declines a CPE visit at this time.  Signed:  Santiago BumpersPhil Uno Esau, MD           08/29/2016

## 2016-09-05 ENCOUNTER — Telehealth: Payer: Self-pay | Admitting: Family Medicine

## 2016-09-05 ENCOUNTER — Encounter: Payer: Self-pay | Admitting: Family Medicine

## 2016-09-05 NOTE — Telephone Encounter (Signed)
Pls notify pt that you tracked down his Cologuard result and it was NEGATIVE.  We'll repeat this in 3 yrs.--thx

## 2016-09-06 NOTE — Telephone Encounter (Signed)
Pt advised and voiced understanding.   

## 2016-11-16 ENCOUNTER — Other Ambulatory Visit: Payer: Self-pay | Admitting: Family Medicine

## 2016-11-16 MED ORDER — AMPHETAMINE-DEXTROAMPHETAMINE 10 MG PO TABS: ORAL_TABLET | ORAL | 0 refills | Status: DC

## 2016-11-16 NOTE — Telephone Encounter (Signed)
Patient requesting refill of adderall.  Patient last seen 08/29/16.  Last RX states fill on or after 10/27/16.  Please advise.

## 2016-11-16 NOTE — Telephone Encounter (Signed)
Please advise. Thanks.  

## 2016-11-16 NOTE — Telephone Encounter (Signed)
Rx's put up front for pt to pick up. Pt advised and voiced understanding.   

## 2017-03-21 DIAGNOSIS — H2513 Age-related nuclear cataract, bilateral: Secondary | ICD-10-CM | POA: Diagnosis not present

## 2017-03-21 DIAGNOSIS — H25043 Posterior subcapsular polar age-related cataract, bilateral: Secondary | ICD-10-CM | POA: Diagnosis not present

## 2017-03-25 ENCOUNTER — Encounter: Payer: Self-pay | Admitting: *Deleted

## 2017-03-25 ENCOUNTER — Encounter: Payer: Self-pay | Admitting: Family Medicine

## 2017-03-25 ENCOUNTER — Ambulatory Visit (INDEPENDENT_AMBULATORY_CARE_PROVIDER_SITE_OTHER): Payer: Medicare Other | Admitting: Family Medicine

## 2017-03-25 VITALS — BP 96/57 | HR 79 | Temp 98.1°F | Resp 16 | Ht 69.5 in | Wt 168.5 lb

## 2017-03-25 DIAGNOSIS — F909 Attention-deficit hyperactivity disorder, unspecified type: Secondary | ICD-10-CM | POA: Diagnosis not present

## 2017-03-25 DIAGNOSIS — Z23 Encounter for immunization: Secondary | ICD-10-CM

## 2017-03-25 MED ORDER — AMPHETAMINE-DEXTROAMPHETAMINE 10 MG PO TABS: ORAL_TABLET | ORAL | 0 refills | Status: DC

## 2017-03-25 NOTE — Progress Notes (Signed)
OFFICE VISIT  03/25/2017   CC:  Chief Complaint  Patient presents with  . Follow-up    RCI, pt is not fasting.    HPI:    Patient is a 66 y.o. Caucasian male who presents for 6 mo f/u adult ADD. Stable on adderall 10mg  bid--helps focus, concentration, motivation, mood.  Denies side effects. Takes med daily. 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    BP a little low today in office, but pt is asymptomatic.  He says a recent check of BP at home was 125/75.  ROS: no HAs, no palpitations, no dizziness, no tremors, no vision or hearing complaints.   Past Medical History:  Diagnosis Date  . Adult ADHD   . Colon cancer screening 03/2016   Cologuard NEG--repeat 03/2019.  Marland Kitchen. Former smoker   . 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  With his positive skin test he was treated for latent TB with INH but admits he wasn't fully compliant with the med (CXR pending as of 03/24/15).  Marland Kitchen. History of rib fracture    Multiple on L side (remote past + 04/2015)    Past Surgical History:  Procedure Laterality Date  . SEPTOPLASTY  2010   deviated septum, per pt    Outpatient Medications Prior to Visit  Medication Sig Dispense Refill  . ibuprofen (ADVIL,MOTRIN) 800 MG tablet Take 1 tablet (800 mg total) by mouth every 8 (eight) hours as needed. 90 tablet 0  . amphetamine-dextroamphetamine (ADDERALL) 10 MG tablet 1 tab bid 60 tablet 0  . Ascorbic Acid (VITAMIN C) 100 MG tablet Take 100 mg by mouth daily.    . cholecalciferol (VITAMIN D) 1000 units tablet Take 1,000 Units by mouth daily.    Marland Kitchen. KRILL OIL OMEGA-3 PO Take by mouth.     No facility-administered medications prior to visit.     No Known Allergies  ROS As per HPI  PE: Blood pressure (!) 96/57, pulse 79, temperature 98.1 F (36.7 C), temperature source Oral, resp. rate 16, height 5' 9.5" (1.765 m), weight 168 lb 8 oz (76.4 kg), SpO2 95 %. Wt Readings from Last 2 Encounters:  03/25/17  168 lb 8 oz (76.4 kg)  08/29/16 168 lb 4 oz (76.3 kg)    Gen: alert, oriented x 4, affect pleasant.  Lucid thinking and conversation noted. HEENT: PERRLA, EOMI.   Neck: no LAD, mass, or thyromegaly. CV: RRR, no m/r/g LUNGS: CTA bilat, nonlabored. NEURO: no tremor or tics noted on observation.  Coordination intact. CN 2-12 grossly intact bilaterally, strength 5/5 in all extremeties.  No ataxia.   LABS:    Chemistry   No results found for: NA, K, CL, CO2, BUN, CREATININE, GLU No results found for: CALCIUM, ALKPHOS, AST, ALT, BILITOT     IMPRESSION AND PLAN:  Adult ADD: The current medical regimen is effective;  continue present plan and medications. I printed rx's for adderall 10mg  bid, #60 today for this month, Jan 2019, and Feb 2019.  Appropriate fill on/after date was noted on each rx.  As per our past f/u routine, I'll see him back in office in 6 mo---he'll call in 3 mo for his NEXT 3 months's rx's. [place] online controlled substances prescribing database reviewed today.  No red flags. Controlled substance contract reviewed with pt and signed today. No UDS today.  Flu vaccine given today.  An After Visit Summary was printed and given to the patient.  FOLLOW UP: Return in  about 6 months (around 09/23/2017) for f/u Adult ADD.  Signed:  Santiago BumpersPhil Cambre Matson, MD           03/25/2017

## 2017-03-29 IMAGING — DX DG CHEST 2V
2 series · 2 of 2 positions shown · non-contrast
Comparison: None.

CLINICAL DATA: Patient with positive PPD test.

EXAM:
CHEST  2 VIEW

[chest pa]
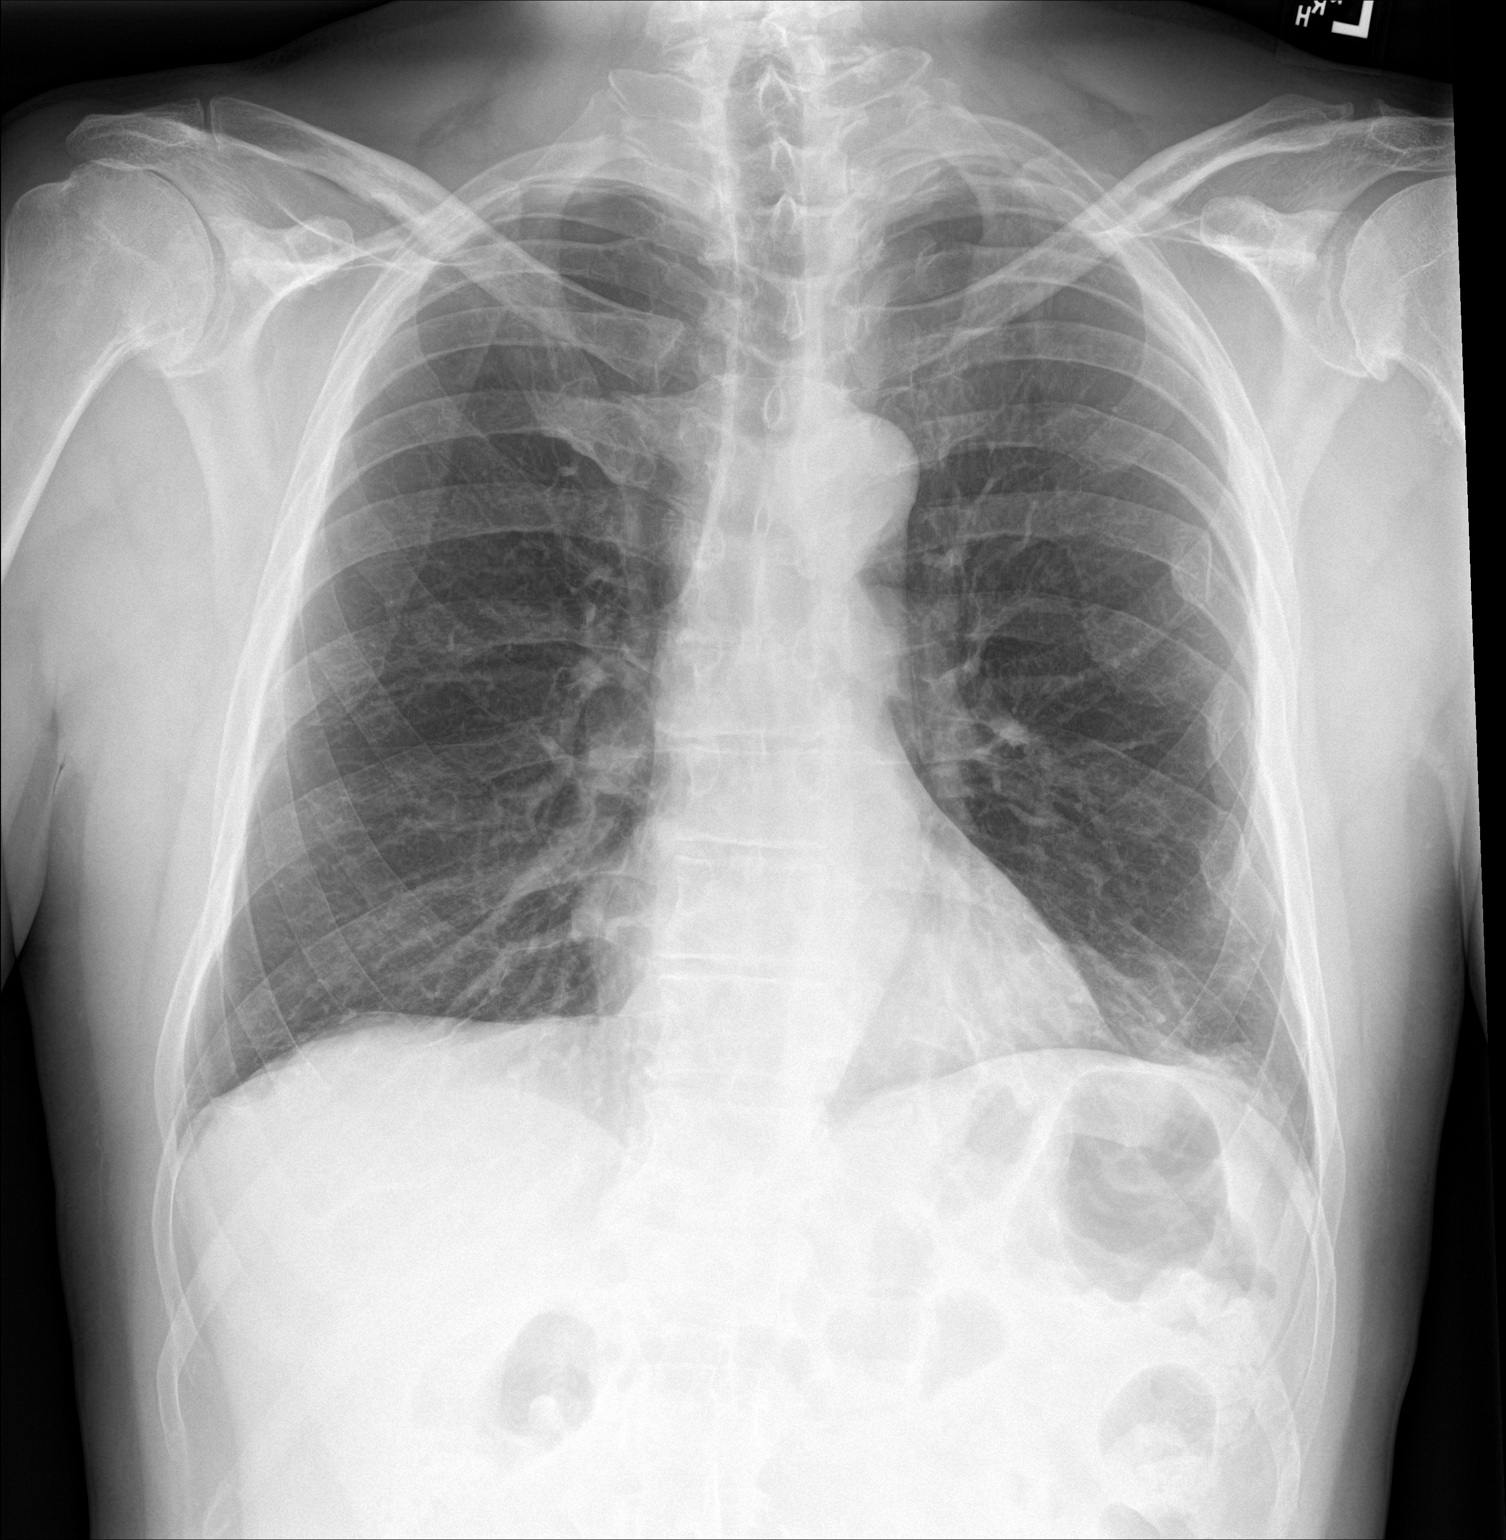

[chest lat]
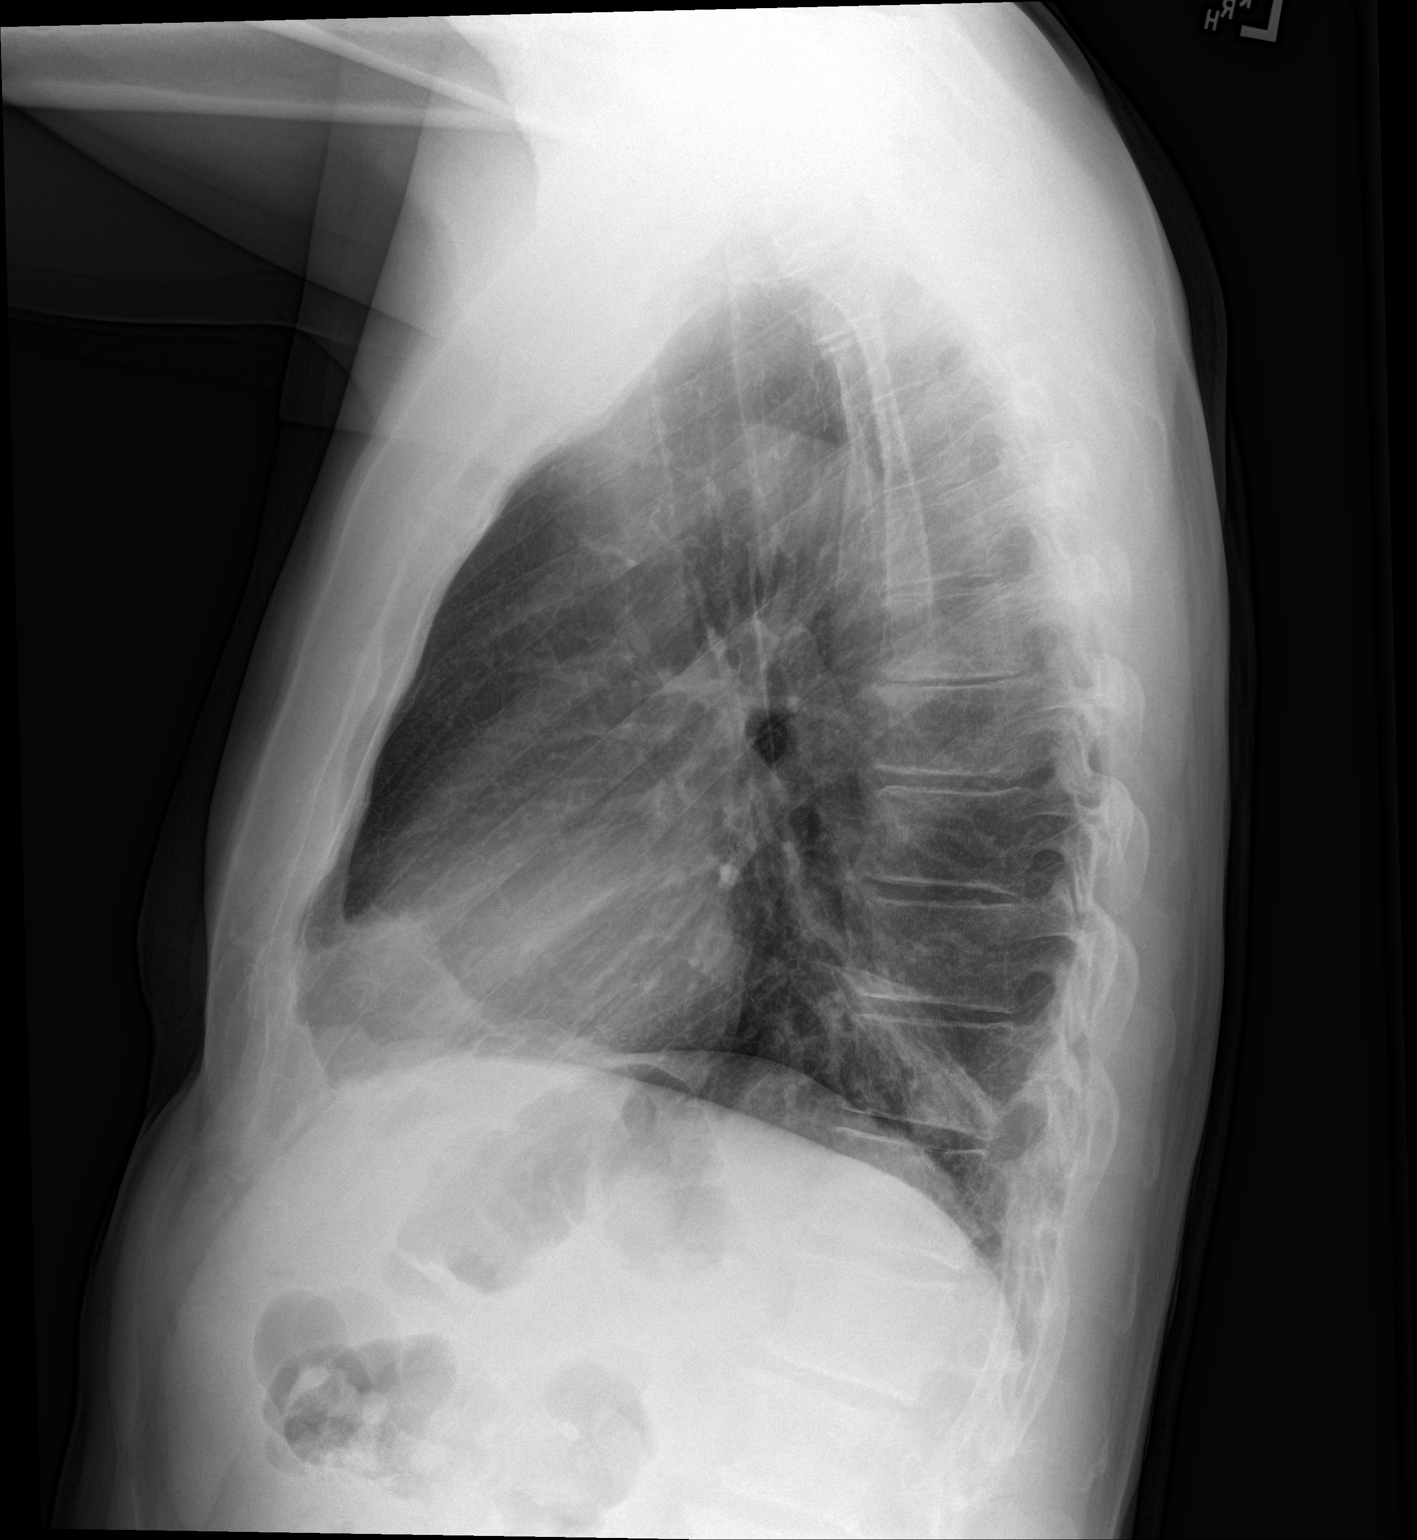

[2 of 2 positions shown; findings below may reference images not displayed]

FINDINGS: Normal cardiac and mediastinal contours. Focal consolidation within
the left lower lung. No pleural effusion or pneumothorax. Multiple
left rib fractures.
IMPRESSION: Focal consolidation within the left lower lung is nonspecific and
may represent atelectasis, scarring and or infection. Recommend
follow-up chest radiograph in 3-4 weeks to assess for interval
change and/or resolution.

## 2017-06-04 DIAGNOSIS — H25013 Cortical age-related cataract, bilateral: Secondary | ICD-10-CM | POA: Diagnosis not present

## 2017-06-04 DIAGNOSIS — H2513 Age-related nuclear cataract, bilateral: Secondary | ICD-10-CM | POA: Diagnosis not present

## 2017-06-04 DIAGNOSIS — H18413 Arcus senilis, bilateral: Secondary | ICD-10-CM | POA: Diagnosis not present

## 2017-06-04 DIAGNOSIS — H25043 Posterior subcapsular polar age-related cataract, bilateral: Secondary | ICD-10-CM | POA: Diagnosis not present

## 2017-06-04 DIAGNOSIS — H2511 Age-related nuclear cataract, right eye: Secondary | ICD-10-CM | POA: Diagnosis not present

## 2017-07-24 ENCOUNTER — Telehealth: Payer: Self-pay | Admitting: Family Medicine

## 2017-07-24 MED ORDER — MUPIROCIN 2 % EX OINT: 1.0000 "application " | TOPICAL_OINTMENT | Freq: Three times a day (TID) | CUTANEOUS | 0 refills | Status: DC

## 2017-07-24 MED ORDER — IBUPROFEN 800 MG PO TABS: 800.0000 mg | ORAL_TABLET | Freq: Three times a day (TID) | ORAL | 0 refills | Status: DC | PRN

## 2017-07-24 NOTE — Telephone Encounter (Signed)
Patient requesting refills of the following:  ibuprofen (ADVIL,MOTRIN) 800 MG tablet  Bactroban Ointment 2%  [facility]

## 2017-07-24 NOTE — Telephone Encounter (Signed)
LOV: 03/25/17 NOV: None  Please advise. Thanks.

## 2017-07-24 NOTE — Telephone Encounter (Signed)
OK, rx's sent to [facility].

## 2017-07-24 NOTE — Telephone Encounter (Signed)
Left message stating that Rx's requested has been sent to pts pharmacy.

## 2017-08-23 ENCOUNTER — Encounter: Payer: Self-pay | Admitting: Family Medicine

## 2017-08-23 ENCOUNTER — Ambulatory Visit (INDEPENDENT_AMBULATORY_CARE_PROVIDER_SITE_OTHER): Payer: Medicare Other | Admitting: Family Medicine

## 2017-08-23 VITALS — BP 126/79 | HR 69 | Temp 98.2°F | Resp 16 | Ht 69.5 in | Wt 168.4 lb

## 2017-08-23 DIAGNOSIS — Z79899 Other long term (current) drug therapy: Secondary | ICD-10-CM | POA: Diagnosis not present

## 2017-08-23 DIAGNOSIS — R6882 Decreased libido: Secondary | ICD-10-CM | POA: Diagnosis not present

## 2017-08-23 DIAGNOSIS — Z125 Encounter for screening for malignant neoplasm of prostate: Secondary | ICD-10-CM | POA: Diagnosis not present

## 2017-08-23 DIAGNOSIS — F909 Attention-deficit hyperactivity disorder, unspecified type: Secondary | ICD-10-CM

## 2017-08-23 MED ORDER — AMPHETAMINE-DEXTROAMPHETAMINE 10 MG PO TABS: ORAL_TABLET | ORAL | 0 refills | Status: DC

## 2017-08-23 NOTE — Progress Notes (Signed)
OFFICE VISIT  08/23/2017   CC:  Chief Complaint  Patient presents with  . Follow-up    RCI, pt is not fasting.   HPI:    Patient is a 67 y.o. Caucasian male who presents for adult ADHD f/u. Has been doing well historically on adderall IR/short acting , 1 bid.  Pt states all is going well with the med at current dosing: much improved focus, concentration, task completion.  Less frustration, better multitasking, less impulsivity and restlessness.  Mood is stable. No side effects from the medication. 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  He says he has decreased libido, has had this for about 1 yr.  No ED.  He asks for testosterone level check. Also asks for PSA for prostate ca screening. Also, given his chronic use of adderall and ibuprofen he says he would like CMET ordered.  He specifically declines a CPE as well as CBC w/diff, TSH, or lipid panel.  Past Medical History:  Diagnosis Date  . Adult ADHD   . Colon cancer screening 03/2016   Cologuard NEG--repeat 03/2019.  Marland Kitchen Former smoker   . 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  With his positive skin test he was treated for latent TB with INH but admits he wasn't fully compliant with the med (CXR pending as of 03/24/15).  Marland Kitchen History of rib fracture    Multiple on L side (remote past + 04/2015)    Past Surgical History:  Procedure Laterality Date  . SEPTOPLASTY  2010   deviated septum, per pt    Outpatient Medications Prior to Visit  Medication Sig Dispense Refill  . ibuprofen (ADVIL,MOTRIN) 800 MG tablet Take 1 tablet (800 mg total) by mouth every 8 (eight) hours as needed. 90 tablet 0  . mupirocin ointment (BACTROBAN) 2 % Apply 1 application topically 3 (three) times daily. 15 g 0  . amphetamine-dextroamphetamine (ADDERALL) 10 MG tablet 1 tab bid 60 tablet 0   No facility-administered medications prior to visit.     No Known  Allergies  ROS As per HPI  PE: Blood pressure 126/79, pulse 69, temperature 98.2 F (36.8 C), temperature source Oral, resp. rate 16, height 5' 9.5" (1.765 m), weight 168 lb 6 oz (76.4 kg), SpO2 96 %. Wt Readings from Last 2 Encounters:  08/23/17 168 lb 6 oz (76.4 kg)  03/25/17 168 lb 8 oz (76.4 kg)    Gen: alert, oriented x 4, affect pleasant.  Lucid thinking and conversation noted. HEENT: PERRLA, EOMI.   Neck: no LAD, mass, or thyromegaly. CV: RRR, no m/r/g LUNGS: CTA bilat, nonlabored. NEURO: no tremor or tics noted on observation.  Coordination intact. CN 2-12 grossly intact bilaterally, strength 5/5 in all extremeties.  No ataxia.   LABS:    Chemistry   No results found for: NA, K, CL, CO2, BUN, CREATININE, GLU No results found for: CALCIUM, ALKPHOS, AST, ALT, BILITOT     IMPRESSION AND PLAN:  1) Adult ADD: The current medical regimen is effective;  continue present plan and medications. I printed rx's for adderall , 1 bid, #60 today for this month, June 2019, and July 2019.  Appropriate fill on/after date was noted on each rx.  2) Decreased libido: future testosterone lab ordered.  3) High risk med use (adderall and ibuprofen): CMET ordered.  4) Prostate cancer screening: will check PSA as per pt's request.  He specifically declines  to have a DRE or CPE.  An After Visit Summary was printed and given to the patient.  FOLLOW UP: Return in about 3 months (around 11/23/2017) for routine chronic illness f/u.  Signed:  Santiago Bumpers, MD           08/23/2017

## 2017-08-26 LAB — PAIN MGMT, PROFILE 8 W/CONF, U
6 ACETYLMORPHINE: NEGATIVE ng/mL (ref <10)
ALPHAHYDROXYALPRAZOLAM: NEGATIVE ng/mL (ref <25)
ALPHAHYDROXYMIDAZOLAM: NEGATIVE ng/mL (ref <50)
AMPHETAMINE: 14651 ng/mL — AB (ref <250)
Alcohol Metabolites: POSITIVE ng/mL — AB (ref <500)
Alphahydroxytriazolam: NEGATIVE ng/mL (ref <50)
Aminoclonazepam: NEGATIVE ng/mL (ref <25)
Amphetamines: POSITIVE ng/mL — AB (ref <500)
Benzodiazepines: NEGATIVE ng/mL (ref <100)
Buprenorphine, Urine: NEGATIVE ng/mL (ref <5)
COCAINE METABOLITE: NEGATIVE ng/mL (ref <150)
CREATININE: 285.7 mg/dL
ETHYL SULFATE (ETS): 5715 ng/mL — AB (ref <100)
Ethyl Glucuronide (ETG): 22718 ng/mL — ABNORMAL HIGH (ref <500)
Hydroxyethylflurazepam: NEGATIVE ng/mL (ref <50)
LORAZEPAM: NEGATIVE ng/mL (ref <50)
MDMA: NEGATIVE ng/mL (ref <500)
Marijuana Metabolite: NEGATIVE ng/mL (ref <20)
Methamphetamine: NEGATIVE ng/mL (ref <250)
NORDIAZEPAM: NEGATIVE ng/mL (ref <50)
OPIATES: NEGATIVE ng/mL (ref <100)
OXIDANT: NEGATIVE ug/mL (ref <200)
Oxazepam: NEGATIVE ng/mL (ref <50)
Oxycodone: NEGATIVE ng/mL (ref <100)
PH: 5.59 (ref 4.5–9.0)
Temazepam: NEGATIVE ng/mL (ref <50)

## 2017-08-26 NOTE — Progress Notes (Deleted)
Subjective:   Clinton Adams is a 67 y.o. male who presents for Medicare Annual/Subsequent preventive examination.  Review of Systems:  No ROS.  Medicare Wellness Visit. Additional risk factors are reflected in the social history.    Sleep patterns:  Home Safety/Smoke Alarms: Feels safe in home. Smoke alarms in place.  Living environment; residence and Firearm Safety:  Seat Belt Safety/Bike Helmet: Wears seat belt.   Male:   CCS-Cologuard completed 04/04/16, negative.  PSA- No results found for: PSA      Objective:    Vitals: There were no vitals taken for this visit.  There is no height or weight on file to calculate BMI.  Advanced Directives 03/30/2016  Does Patient Have a Medical Advance Directive? No  Would patient like information on creating a medical advance directive? No - Patient declined    Tobacco Social History   Tobacco Use  Smoking Status Former Smoker  . Packs/day: 1.00  . Years: 30.00  . Pack years: 30.00  . Types: Cigarettes  . Last attempt to quit: 04/23/1994  . Years since quitting: 23.3  Smokeless Tobacco Never Used     Counseling given: Not Answered    Past Medical History:  Diagnosis Date  . Adult ADHD   . Colon cancer screening 03/2016   Cologuard NEG--repeat 03/2019.  Marland Kitchen Former smoker   . 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  With his positive skin test he was treated for latent TB with INH but admits he wasn't fully compliant with the med (CXR pending as of 03/24/15).  Marland Kitchen History of rib fracture    Multiple on L side (remote past + 04/2015)   Past Surgical History:  Procedure Laterality Date  . SEPTOPLASTY  2010   deviated septum, per pt   Family History  Problem Relation Age of Onset  . Hypertension Mother   . Lung cancer Father    Social History   Socioeconomic History  . Marital status: Married    Spouse name: Not on file  . Number of children: Not on file  . Years of education: Not on  file  . Highest education level: Not on file  Occupational History  . Not on file  Social Needs  . Financial resource strain: Not on file  . Food insecurity:    Worry: Not on file    Inability: Not on file  . Transportation needs:    Medical: Not on file    Non-medical: Not on file  Tobacco Use  . Smoking status: Former Smoker    Packs/day: 1.00    Years: 30.00    Pack years: 30.00    Types: Cigarettes    Last attempt to quit: 04/23/1994    Years since quitting: 23.3  . Smokeless tobacco: Never Used  Substance and Sexual Activity  . Alcohol use: Yes    Comment: daily  . Drug use: No  . Sexual activity: Not on file  Lifestyle  . Physical activity:    Days per week: Not on file    Minutes per session: Not on file  . Stress: Not on file  Relationships  . Social connections:    Talks on phone: Not on file    Gets together: Not on file    Attends religious service: Not on file    Active member of club or organization: Not on file    Attends meetings of clubs or organizations: Not on file  Relationship status: Not on file  Other Topics Concern  . Not on file  Social History Narrative   Married, one daughter.  No grandchilden.   Occup: Retired PA, CV surg/trauma surg/ortho, some FP.   Owns [employer] in [place], [place].   Former smoker; 30 pack-yr hx, quit age 60.  Alc:  1-2 hard liquor drinks a day.   Exercise: bicycle    Outpatient Encounter Medications as of 08/27/2017  Medication Sig  . amphetamine-dextroamphetamine (ADDERALL) 10 MG tablet 1 tab bid  . ibuprofen (ADVIL,MOTRIN) 800 MG tablet Take 1 tablet (800 mg total) by mouth every 8 (eight) hours as needed.  . mupirocin ointment (BACTROBAN) 2 % Apply 1 application topically 3 (three) times daily.   No facility-administered encounter medications on file as of 08/27/2017.     Activities of Daily Living No flowsheet data found.  Patient Care Team: Jeoffrey Massed, MD as PCP - General (Family Medicine)     Assessment:   This is a routine wellness examination for Clinton Adams.  Exercise Activities and Dietary recommendations   Diet (meal preparation, eat out, water intake, caffeinated beverages, dairy products, fruits and vegetables):   Breakfast: Lunch:  Dinner:      Goals    . Weight (lb) < 155 lb (70.3 kg)     Lose weight with lower calore intake.        Fall Risk Fall Risk  03/30/2016 08/04/2015  Falls in the past year? Yes Yes  Comment Slipped down stairs, broke rib -  Number falls in past yr: 1 1  Comment - slipped on some steps  Injury with Fall? Yes Yes  Comment - fractured rib  Risk for fall due to : History of fall(s) -  Follow up Falls prevention discussed -    Depression Screen PHQ 2/9 Scores 03/30/2016 08/04/2015  PHQ - 2 Score 6 0  PHQ- 9 Score 7 -    Cognitive Function        Immunization History  Administered Date(s) Administered  . Influenza, High Dose Seasonal PF 03/25/2017  . Influenza-Unspecified 03/08/2016    Screening Tests Health Maintenance  Topic Date Due  . Hepatitis C Screening  [birth year]  . TETANUS/TDAP  12/15/1968  . PNA vac Low Risk Adult (1 of 2 - PCV13) 12/16/2014  . INFLUENZA VACCINE  11/21/2017  . COLONOSCOPY  04/13/2019        Plan:     I have personally reviewed and noted the following in the patient's chart:   . Medical and social history . Use of alcohol, tobacco or illicit drugs  . Current medications and supplements . Functional ability and status . Nutritional status . Physical activity . Advanced directives . List of other physicians . Hospitalizations, surgeries, and ER visits in previous 12 months . Vitals . Screenings to include cognitive, depression, and falls . Referrals and appointments  In addition, I have reviewed and discussed with patient certain preventive protocols, quality metrics, and best practice recommendations. A written personalized care plan for preventive services as well as general  preventive health recommendations were provided to patient.     Alysia Penna, RN  08/26/2017

## 2017-08-27 ENCOUNTER — Ambulatory Visit: Payer: Medicare Other

## 2017-08-29 ENCOUNTER — Other Ambulatory Visit (INDEPENDENT_AMBULATORY_CARE_PROVIDER_SITE_OTHER): Payer: Medicare Other

## 2017-08-29 DIAGNOSIS — Z125 Encounter for screening for malignant neoplasm of prostate: Secondary | ICD-10-CM

## 2017-08-29 DIAGNOSIS — Z79899 Other long term (current) drug therapy: Secondary | ICD-10-CM

## 2017-08-29 DIAGNOSIS — R6882 Decreased libido: Secondary | ICD-10-CM | POA: Diagnosis not present

## 2017-08-29 LAB — COMPREHENSIVE METABOLIC PANEL
ALT: 13 U/L (ref 0–53)
AST: 18 U/L (ref 0–37)
Albumin: 3.9 g/dL (ref 3.5–5.2)
Alkaline Phosphatase: 43 U/L (ref 39–117)
BILIRUBIN TOTAL: 0.6 mg/dL (ref 0.2–1.2)
BUN: 17 mg/dL (ref 6–23)
CALCIUM: 9.2 mg/dL (ref 8.4–10.5)
CO2: 31 mEq/L (ref 19–32)
CREATININE: 1.27 mg/dL (ref 0.40–1.50)
Chloride: 104 mEq/L (ref 96–112)
GFR: 59.99 mL/min — ABNORMAL LOW (ref >60.00)
Glucose, Bld: 126 mg/dL — ABNORMAL HIGH (ref 70–99)
Potassium: 4.3 mEq/L (ref 3.5–5.1)
Sodium: 139 mEq/L (ref 135–145)
Total Protein: 7.4 g/dL (ref 6.0–8.3)

## 2017-08-29 LAB — PSA: PSA: 1.52 ng/mL (ref 0.10–4.00)

## 2017-08-29 LAB — TESTOSTERONE: Testosterone: 377.13 ng/dL (ref 300.00–890.00)

## 2017-08-30 ENCOUNTER — Other Ambulatory Visit: Payer: Self-pay | Admitting: Family Medicine

## 2017-08-30 DIAGNOSIS — R739 Hyperglycemia, unspecified: Secondary | ICD-10-CM

## 2017-09-05 ENCOUNTER — Other Ambulatory Visit (INDEPENDENT_AMBULATORY_CARE_PROVIDER_SITE_OTHER): Payer: Medicare Other

## 2017-09-05 DIAGNOSIS — R739 Hyperglycemia, unspecified: Secondary | ICD-10-CM | POA: Diagnosis not present

## 2017-09-05 LAB — GLUCOSE, RANDOM: GLUCOSE: 94 mg/dL (ref 70–99)

## 2017-09-05 LAB — HEMOGLOBIN A1C: Hgb A1c MFr Bld: 5.9 % (ref 4.6–6.5)

## 2017-09-09 ENCOUNTER — Other Ambulatory Visit: Payer: Self-pay

## 2017-09-09 ENCOUNTER — Ambulatory Visit (INDEPENDENT_AMBULATORY_CARE_PROVIDER_SITE_OTHER): Payer: Medicare Other

## 2017-09-09 VITALS — BP 122/70 | HR 82 | Ht 70.0 in | Wt 162.5 lb

## 2017-09-09 DIAGNOSIS — Z0000 Encounter for general adult medical examination without abnormal findings: Secondary | ICD-10-CM

## 2017-09-09 NOTE — Progress Notes (Addendum)
Subjective:   Clinton Adams is a 67 y.o. male who presents for Medicare Annual/Subsequent preventive examination.  Review of Systems:  No ROS.  Medicare Wellness Visit. Additional risk factors are reflected in the social history.  Cardiac Risk Factors include: advanced age (>33men, >63 women);hypertension;male gender   Sleep patterns: Sleeps 6-8 hours.  Home Safety/Smoke Alarms: Feels safe in home. Smoke alarms in place.  Living environment; residence and Firearm Safety: 2-story house with wife, daughter (60 yo) and dog. Seat Belt Safety/Bike Helmet: Wears seat belt.   Male:   CCS-Cologuard 04/04/2016, negative.      PSA-  Lab Results  Component Value Date   PSA 1.52 08/29/2017       Objective:    Vitals: BP 122/70 (BP Location: Left Arm, Patient Position: Sitting, Cuff Size: Normal)   Pulse 82   Ht  (1.778 m)   Wt 162 lb 8 oz (73.7 kg)   SpO2 96%   BMI 23.32 kg/m   Body mass index is 23.32 kg/m.  Advanced Directives 09/09/2017 03/30/2016  Does Patient Have a Medical Advance Directive? No No  Would patient like information on creating a medical advance directive? No - Patient declined No - Patient declined    Tobacco Social History   Tobacco Use  Smoking Status Former Smoker  . Packs/day: 1.00  . Years: 30.00  . Pack years: 30.00  . Types: Cigarettes  . Last attempt to quit: 04/23/1994  . Years since quitting: 23.3  Smokeless Tobacco Never Used     Counseling given: Not Answered     Past Medical History:  Diagnosis Date  . Adult ADHD   . Colon cancer screening 03/2016   Cologuard NEG--repeat 03/2019.  Marland Kitchen Former smoker   . 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  With his positive skin test he was treated for latent TB with INH but admits he wasn't fully compliant with the med (CXR pending as of 03/24/15).  Marland Kitchen History of rib fracture    Multiple on L side (remote past + 04/2015)   Past Surgical History:  Procedure  Laterality Date  . SEPTOPLASTY  2010   deviated septum, per pt   Family History  Problem Relation Age of Onset  . Hypertension Mother   . Lung cancer Father    Social History   Socioeconomic History  . Marital status: Married    Spouse name: Not on file  . Number of children: Not on file  . Years of education: Not on file  . Highest education level: Not on file  Occupational History  . Not on file  Social Needs  . Financial resource strain: Not on file  . Food insecurity:    Worry: Not on file    Inability: Not on file  . Transportation needs:    Medical: Not on file    Non-medical: Not on file  Tobacco Use  . Smoking status: Former Smoker    Packs/day: 1.00    Years: 30.00    Pack years: 30.00    Types: Cigarettes    Last attempt to quit: 04/23/1994    Years since quitting: 23.3  . Smokeless tobacco: Never Used  Substance and Sexual Activity  . Alcohol use: Yes    Comment: daily  . Drug use: No  . Sexual activity: Not on file  Lifestyle  . Physical activity:    Days per week: Not on file    Minutes per session:  Not on file  . Stress: Not on file  Relationships  . Social connections:    Talks on phone: Not on file    Gets together: Not on file    Attends religious service: Not on file    Active member of club or organization: Not on file    Attends meetings of clubs or organizations: Not on file    Relationship status: Not on file  Other Topics Concern  . Not on file  Social History Narrative   Married, one daughter.  No grandchilden.   Occup: Retired PA, CV surg/trauma surg/ortho, some FP.   Owns [employer] in [place], [place].   Former smoker; 30 pack-yr hx, quit age 45.  Alc:  1-2 hard liquor drinks a day.   Exercise: bicycle    Outpatient Encounter Medications as of 09/09/2017  Medication Sig  . amphetamine-dextroamphetamine (ADDERALL) 10 MG tablet 1 tab bid  . ibuprofen (ADVIL,MOTRIN) 800 MG tablet Take 1 tablet (800 mg total) by mouth every 8 (eight)  hours as needed.  . mupirocin ointment (BACTROBAN) 2 % Apply 1 application topically 3 (three) times daily.   No facility-administered encounter medications on file as of 09/09/2017.     Activities of Daily Living In your present state of health, do you have any difficulty performing the following activities: 09/09/2017  Hearing? N  Vision? N  Difficulty concentrating or making decisions? N  Walking or climbing stairs? N  Dressing or bathing? N  Doing errands, shopping? N  Preparing Food and eating ? N  Using the Toilet? N  In the past six months, have you accidently leaked urine? N  Do you have problems with loss of bowel control? N  Managing your Medications? N  Managing your Finances? N  Housekeeping or managing your Housekeeping? N  Some recent data might be hidden    Patient Care Team: Jeoffrey Massed, MD as PCP - General (Family Medicine)   Assessment:   This is a routine wellness examination for [name].  Exercise Activities and Dietary recommendations Current Exercise Habits: The patient does not participate in regular exercise at present, Exercise limited by: None identified   Diet (meal preparation, eat out, water intake, caffeinated beverages, dairy products, fruits and vegetables): Drinks water and Coke.   Eats 2 meals/day.   Goals    . Patient Stated     Maintain current health.     . Weight (lb) < 155 lb (70.3 kg)     Lose weight with lower calore intake.        Fall Risk Fall Risk  09/09/2017 03/30/2016 08/04/2015  Falls in the past year? No Yes Yes  Comment - Slipped down stairs, broke rib -  Number falls in past yr: - 1 1  Comment - - slipped on some steps  Injury with Fall? - Yes Yes  Comment - - fractured rib  Risk for fall due to : - History of fall(s) -  Follow up - Falls prevention discussed -    Depression Screen PHQ 2/9 Scores 09/09/2017 03/30/2016 08/04/2015  PHQ - 2 Score 4 6 0  PHQ- 9 Score 8 7 -    Cognitive Function        Ad8  score reviewed for issues:  Issues making decisions: no  Less interest in hobbies / activities: no  Repeats questions, stories (family complaining): no  Trouble using ordinary gadgets (microwave, computer, phone): no  Forgets the month or year: no  Mismanaging finances:  no  Remembering appts: no  Daily problems with thinking and/or memory: no Ad8 score is=0   Immunization History  Administered Date(s) Administered  . Influenza, High Dose Seasonal PF 03/25/2017  . Influenza-Unspecified 03/08/2016     Screening Tests Health Maintenance  Topic Date Due  . Hepatitis C Screening  Nov 26, 1950  . TETANUS/TDAP  12/15/1968  . PNA vac Low Risk Adult (1 of 2 - PCV13) 12/16/2014  . INFLUENZA VACCINE  11/21/2017  . COLONOSCOPY  04/13/2019        Plan:    Continue to eat heart healthy diet (full of fruits, vegetables, whole grains, lean protein, water--limit salt, fat, and sugar intake) and increase physical activity as tolerated.  I have personally reviewed and noted the following in the patient's chart:   . Medical and social history . Use of alcohol, tobacco or illicit drugs  . Current medications and supplements . Functional ability and status . Nutritional status . Physical activity . Advanced directives . List of other physicians . Hospitalizations, surgeries, and ER visits in previous 12 months . Vitals . Screenings to include cognitive, depression, and falls . Referrals and appointments  In addition, I have reviewed and discussed with patient certain preventive protocols, quality metrics, and best practice recommendations. A written personalized care plan for preventive services as well as general preventive health recommendations were provided to patient.     Alysia Penna, RN  09/09/2017  PCP Notes: -C/O cough/fever x 6 days. Declines appt with PCP -PHQ9=8, states he is taking Adderall for depression and is working well.   Medical screening  examination/treatment/procedure(s) were performed by non-physician practitioner and as supervising physician I was immediately available for consultation/collaboration.  I agree with above assessment and plan.  Electronically Signed by: Felix Pacini, DO [facility]- OR

## 2017-09-09 NOTE — Patient Instructions (Addendum)
Continue to eat heart healthy diet (full of fruits, vegetables, whole grains, lean protein, water--limit salt, fat, and sugar intake) and increase physical activity as tolerated.   Health Maintenance, Male A healthy lifestyle and preventive care is important for your health and wellness. Ask your health care provider about what schedule of regular examinations is right for you. What should I know about weight and diet? Eat a Healthy Diet  Eat plenty of vegetables, fruits, whole grains, low-fat dairy products, and lean protein.  Do not eat a lot of foods high in solid fats, added sugars, or salt.  Maintain a Healthy Weight Regular exercise can help you achieve or maintain a healthy weight. You should:  Do at least 150 minutes of exercise each week. The exercise should increase your heart rate and make you sweat (moderate-intensity exercise).  Do strength-training exercises at least twice a week.  Watch Your Levels of Cholesterol and Blood Lipids  Have your blood tested for lipids and cholesterol every 5 years starting at 68 years of age. If you are at high risk for heart disease, you should start having your blood tested when you are 68 years old. You may need to have your cholesterol levels checked more often if: ? Your lipid or cholesterol levels are high. ? You are older than 68 years of age. ? You are at high risk for heart disease.  What should I know about cancer screening? Many types of cancers can be detected early and may often be prevented. Lung Cancer  You should be screened every year for lung cancer if: ? You are a current smoker who has smoked for at least 30 years. ? You are a former smoker who has quit within the past 15 years.  Talk to your health care provider about your screening options, when you should start screening, and how often you should be screened.  Colorectal Cancer  Routine colorectal cancer screening usually begins at 68 years of age and should be  repeated every 5-10 years until you are 68 years old. You may need to be screened more often if early forms of precancerous polyps or small growths are found. Your health care provider may recommend screening at an earlier age if you have risk factors for colon cancer.  Your health care provider may recommend using home test kits to check for hidden blood in the stool.  A small camera at the end of a tube can be used to examine your colon (sigmoidoscopy or colonoscopy). This checks for the earliest forms of colorectal cancer.  Prostate and Testicular Cancer  Depending on your age and overall health, your health care provider may do certain tests to screen for prostate and testicular cancer.  Talk to your health care provider about any symptoms or concerns you have about testicular or prostate cancer.  Skin Cancer  Check your skin from head to toe regularly.  Tell your health care provider about any new moles or changes in moles, especially if: ? There is a change in a mole's size, shape, or color. ? You have a mole that is larger than a pencil eraser.  Always use sunscreen. Apply sunscreen liberally and repeat throughout the day.  Protect yourself by wearing long sleeves, pants, a wide-brimmed hat, and sunglasses when outside.  What should I know about heart disease, diabetes, and high blood pressure?  If you are 46-43 years of age, have your blood pressure checked every 3-5 years. If you are 76 years of age  or older, have your blood pressure checked every year. You should have your blood pressure measured twice-once when you are at a hospital or clinic, and once when you are not at a hospital or clinic. Record the average of the two measurements. To check your blood pressure when you are not at a hospital or clinic, you can use: ? An automated blood pressure machine at a pharmacy. ? A home blood pressure monitor.  Talk to your health care provider about your target blood  pressure.  If you are between 103-72 years old, ask your health care provider if you should take aspirin to prevent heart disease.  Have regular diabetes screenings by checking your fasting blood sugar level. ? If you are at a normal weight and have a low risk for diabetes, have this test once every three years after the age of 42. ? If you are overweight and have a high risk for diabetes, consider being tested at a younger age or more often.  A one-time screening for abdominal aortic aneurysm (AAA) by ultrasound is recommended for men aged 65-75 years who are current or former smokers. What should I know about preventing infection? Hepatitis B If you have a higher risk for hepatitis B, you should be screened for this virus. Talk with your health care provider to find out if you are at risk for hepatitis B infection. Hepatitis C Blood testing is recommended for:  Everyone born from [birth year] through 1966.  Anyone with known risk factors for hepatitis C.  Sexually Transmitted Diseases (STDs)  You should be screened each year for STDs including gonorrhea and chlamydia if: ? You are sexually active and are younger than 68 years of age. ? You are older than 68 years of age and your health care provider tells you that you are at risk for this type of infection. ? Your sexual activity has changed since you were last screened and you are at an increased risk for chlamydia or gonorrhea. Ask your health care provider if you are at risk.  Talk with your health care provider about whether you are at high risk of being infected with HIV. Your health care provider may recommend a prescription medicine to help prevent HIV infection.  What else can I do?  Schedule regular health, dental, and eye exams.  Stay current with your vaccines (immunizations).  Do not use any tobacco products, such as cigarettes, chewing tobacco, and e-cigarettes. If you need help quitting, ask your health care  provider.  Limit alcohol intake to no more than 2 drinks per day. One drink equals 12 ounces of beer, 5 ounces of wine, or 1 ounces of hard liquor.  Do not use street drugs.  Do not share needles.  Ask your health care provider for help if you need support or information about quitting drugs.  Tell your health care provider if you often feel depressed.  Tell your health care provider if you have ever been abused or do not feel safe at home. This information is not intended to replace advice given to you by your health care provider. Make sure you discuss any questions you have with your health care provider. Document Released: 10/06/2007 Document Revised: 12/07/2015 Document Reviewed: 01/11/2015 Elsevier Interactive Patient Education  [employer].

## 2017-09-10 NOTE — Progress Notes (Signed)
AWV reviewed and agree. Signed:  Phil McGowen, MD           09/10/2017  

## 2018-01-15 LAB — FECAL OCCULT BLOOD, IMMUNOCHEMICAL: IFOBT: NEGATIVE

## 2018-01-27 ENCOUNTER — Ambulatory Visit (INDEPENDENT_AMBULATORY_CARE_PROVIDER_SITE_OTHER): Payer: Medicare Other | Admitting: Family Medicine

## 2018-01-27 ENCOUNTER — Encounter: Payer: Self-pay | Admitting: Family Medicine

## 2018-01-27 VITALS — BP 119/71 | HR 66 | Temp 97.8°F | Resp 16 | Ht 69.5 in | Wt 167.4 lb

## 2018-01-27 DIAGNOSIS — Z8781 Personal history of (healed) traumatic fracture: Secondary | ICD-10-CM

## 2018-01-27 DIAGNOSIS — Z23 Encounter for immunization: Secondary | ICD-10-CM | POA: Diagnosis not present

## 2018-01-27 DIAGNOSIS — M7741 Metatarsalgia, right foot: Secondary | ICD-10-CM | POA: Diagnosis not present

## 2018-01-27 DIAGNOSIS — R7303 Prediabetes: Secondary | ICD-10-CM | POA: Diagnosis not present

## 2018-01-27 DIAGNOSIS — F909 Attention-deficit hyperactivity disorder, unspecified type: Secondary | ICD-10-CM | POA: Diagnosis not present

## 2018-01-27 MED ORDER — AMPHETAMINE-DEXTROAMPHETAMINE 10 MG PO TABS: ORAL_TABLET | ORAL | 0 refills | Status: DC
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
## 2018-02-13 ENCOUNTER — Encounter: Payer: Self-pay | Admitting: Podiatry

## 2018-02-13 ENCOUNTER — Ambulatory Visit: Payer: Medicare Other | Admitting: Podiatry

## 2018-02-13 ENCOUNTER — Ambulatory Visit (INDEPENDENT_AMBULATORY_CARE_PROVIDER_SITE_OTHER): Payer: Medicare Other

## 2018-02-13 DIAGNOSIS — M779 Enthesopathy, unspecified: Secondary | ICD-10-CM | POA: Diagnosis not present

## 2018-02-13 DIAGNOSIS — M778 Other enthesopathies, not elsewhere classified: Secondary | ICD-10-CM

## 2018-02-13 MED ORDER — TRIAMCINOLONE ACETONIDE 10 MG/ML IJ SUSP
10.0000 mg | Freq: Once | INTRAMUSCULAR | Status: AC
  Administered 2018-02-13: 10 mg

## 2018-02-14 NOTE — Progress Notes (Signed)
Subjective:   Patient ID: Clinton Adams, male   DOB: 67 y.o.   MRN: [id number]   HPI Patient states that he broke his right foot approximately 32 years ago and he has done actually well with that healing but he is developed pain recently and is wondering if it might be related.  Patient does not smoke and likes to be active and states the pain is been present for around a month and is occurring around the forefoot and is worse with walking   Review of Systems  All other systems reviewed and are negative.       Objective:  Physical Exam  Constitutional: He appears well-developed and well-nourished.  Cardiovascular: Intact distal pulses.  Pulmonary/Chest: Effort normal.  Musculoskeletal: Normal range of motion.  Neurological: He is alert.  Skin: Skin is warm.  Nursing note and vitals reviewed.   Neurovascular status intact muscle strength is adequate range of motion within normal limits with patient found to have exquisite discomfort in the third metatarsal phalangeal joint right with what appears to be moderate prominence of the metatarsal itself.  Patient is noted to have good digital perfusion and is well oriented x3     Assessment:  Probability for acute capsulitis third MPJ right which may be related to the realignment of bones or may be coincidental secondary to foot structure the patient has     Plan:  H&P condition reviewed and went ahead and did a proximal nerve block of the right forefoot and then did sterile prep aspirated the third MPJ getting out a small amount of the bloody infiltrate indicating probable capsule damage and injected quarter cc dexamethasone Kenalog.  Applied padding diffuse pressure and advised on rigid bottom shoes and reappoint for [place] to recheck again in 2 weeks and may require orthotics or possible surgical intervention in future  X-ray indicates patient had severe fracture of the shaft of the second metatarsal right fifth metatarsal right both which  have healed with no indications of arthritis or pathology of the third MPJ

## 2018-02-27 ENCOUNTER — Ambulatory Visit: Payer: Medicare Other | Admitting: Podiatry

## 2018-04-23 DIAGNOSIS — M19011 Primary osteoarthritis, right shoulder: Secondary | ICD-10-CM

## 2018-04-23 HISTORY — DX: Primary osteoarthritis, right shoulder: M19.011

## 2018-05-14 ENCOUNTER — Ambulatory Visit: Payer: Medicare Other | Admitting: Family Medicine

## 2018-05-15 ENCOUNTER — Encounter: Payer: Self-pay | Admitting: *Deleted

## 2018-05-15 ENCOUNTER — Ambulatory Visit (INDEPENDENT_AMBULATORY_CARE_PROVIDER_SITE_OTHER): Payer: Medicare Other | Admitting: Family Medicine

## 2018-05-15 ENCOUNTER — Encounter: Payer: Self-pay | Admitting: Family Medicine

## 2018-05-15 VITALS — BP 142/82 | HR 68 | Temp 98.1°F | Resp 16 | Ht 69.5 in | Wt 171.0 lb

## 2018-05-15 DIAGNOSIS — Z8615 Personal history of latent tuberculosis infection: Secondary | ICD-10-CM

## 2018-05-15 DIAGNOSIS — F909 Attention-deficit hyperactivity disorder, unspecified type: Secondary | ICD-10-CM | POA: Diagnosis not present

## 2018-05-15 MED ORDER — AMPHETAMINE-DEXTROAMPHETAMINE 10 MG PO TABS: ORAL_TABLET | ORAL | 0 refills | Status: DC

## 2018-05-15 NOTE — Progress Notes (Signed)
OFFICE VISIT  05/15/2018   CC:  Chief Complaint  Patient presents with  . Follow-up    RCI    HPI:    Patient is a 68 y.o. Caucasian male who presents for f/u adult ADD.  Pt states all is going well with the med at current dosing: much improved focus, concentration, task completion.  Less frustration, better multitasking, less impulsivity and restlessness.  Mood is stable. No side effects from the medication. Takes the med daily--bid.  Has hx of +PPD, was treated for latent TB but admits he wasn't fully compliant with the med. He requests a repeat CXR now.  Past Medical History:  Diagnosis Date  . Adult ADHD   . Colon cancer screening 03/2016   Cologuard NEG--repeat 03/2019.  FIT test NEG 12/22/17.  . Former smoker   . 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  With his positive skin test he was treated for latent TB with INH but admits he wasn't fully compliant with the med (CXR pending as of 03/24/15).  Marland Kitchen History of rib fracture    Multiple on L side (remote past + 04/2015)  . Multiple closed fractures of metatarsal bone of right foot    2nd and 5th metatarsals  . Prediabetes    A1c 5.9% 08/2017    Past Surgical History:  Procedure Laterality Date  . SEPTOPLASTY  2010   deviated septum, per pt    Outpatient Medications Prior to Visit  Medication Sig Dispense Refill  . ibuprofen (ADVIL,MOTRIN) 800 MG tablet Take 1 tablet (800 mg total) by mouth every 8 (eight) hours as needed. 90 tablet 0  . mupirocin ointment (BACTROBAN) 2 % Apply 1 application topically 3 (three) times daily. 15 g 0  . amphetamine-dextroamphetamine (ADDERALL) 10 MG tablet 1 tab bid 60 tablet 0   No facility-administered medications prior to visit.     No Known Allergies  ROS As per HPI  PE: Blood pressure (!) 142/82, pulse 68, temperature 98.1 F (36.7 C), temperature source Oral, resp. rate 16, height 5' 9.5" (1.765 m), weight 171 lb (77.6 kg), SpO2 97 %. Body  mass index is 24.89 kg/m.  Wt Readings from Last 2 Encounters:  05/15/18 171 lb (77.6 kg)  01/27/18 167 lb 6 oz (75.9 kg)    Gen: alert, oriented x 4, affect pleasant.  Lucid thinking and conversation noted. HEENT: PERRLA, EOMI.   Neck: no LAD, mass, or thyromegaly. CV: RRR, no m/r/g LUNGS: CTA bilat, nonlabored. NEURO: no tremor or tics noted on observation.  Coordination intact. CN 2-12 grossly intact bilaterally, strength 5/5 in all extremeties.  No ataxia.   LABS:   Lab Results  Component Value Date   TESTOSTERONE 377.13 08/29/2017    IMPRESSION AND PLAN:  Adult ADHD: The current medical regimen is effective;  continue present plan and medications. I did electronic rx's for adderall 10mg , 1 bid, #60 today for this month, feb 2020, and mar 2020.   Appropriate fill on/after date was noted on each rx. Controlled substance contract updated today. Will do UDS at next f/u visit.  Ordered CXR today for pt to get for hx of latent TB and mild noncompliance with the med regimen for this. He says he'll likely get it in a couple months.  He is also interested in testosterone recheck around the time of next f/u visit (for his decreased libido).  An After Visit Summary was printed and given to the patient.  FOLLOW UP:  Return in about 3 months (around 08/14/2018) for routine chronic illness f/u.  Signed:  Santiago BumpersPhil McGowen, MD           05/15/2018

## 2018-08-15 ENCOUNTER — Other Ambulatory Visit: Payer: Self-pay | Admitting: Family Medicine

## 2018-08-15 NOTE — Telephone Encounter (Signed)
RF request for Adderall 10mg  tab LOV: 05/15/18 Next ov: none Last written: 05/15/18 #60 w/ 0RF  Please advise, thanks. PMP aware printed and medication pending.

## 2018-09-18 ENCOUNTER — Telehealth: Payer: Self-pay | Admitting: Family Medicine

## 2018-09-18 NOTE — Telephone Encounter (Signed)
adderral refill request - pharmacy is [facility]  Thank you

## 2018-09-22 MED ORDER — AMPHETAMINE-DEXTROAMPHETAMINE 10 MG PO TABS: ORAL_TABLET | ORAL | 0 refills | Status: DC

## 2018-09-22 NOTE — Telephone Encounter (Signed)
LM advising pt medication was sent.

## 2018-09-22 NOTE — Telephone Encounter (Signed)
RF request for Adderall LOV: 05/15/18 Next ov: advised to f/u 3 months Last written: 08/15/18 (60,0) Last CSC:05/15/18 w/ UDS: 08/23/17  PMP aware printed. Please advise on refill, thanks.

## 2018-09-22 NOTE — Telephone Encounter (Signed)
OK: I eRx'd adderall rx's for the next 3 months.

## 2018-11-21 ENCOUNTER — Other Ambulatory Visit: Payer: Self-pay

## 2018-11-21 ENCOUNTER — Encounter: Payer: Self-pay | Admitting: Family Medicine

## 2018-11-21 ENCOUNTER — Ambulatory Visit (INDEPENDENT_AMBULATORY_CARE_PROVIDER_SITE_OTHER): Payer: Medicare Other | Admitting: Family Medicine

## 2018-11-21 VITALS — BP 123/79 | HR 61 | Temp 98.3°F | Resp 16 | Ht 69.5 in | Wt 166.0 lb

## 2018-11-21 DIAGNOSIS — R6882 Decreased libido: Secondary | ICD-10-CM | POA: Diagnosis not present

## 2018-11-21 DIAGNOSIS — G8929 Other chronic pain: Secondary | ICD-10-CM

## 2018-11-21 DIAGNOSIS — R5383 Other fatigue: Secondary | ICD-10-CM | POA: Diagnosis not present

## 2018-11-21 DIAGNOSIS — M25561 Pain in right knee: Secondary | ICD-10-CM | POA: Diagnosis not present

## 2018-11-21 DIAGNOSIS — F909 Attention-deficit hyperactivity disorder, unspecified type: Secondary | ICD-10-CM

## 2018-11-21 DIAGNOSIS — M25511 Pain in right shoulder: Secondary | ICD-10-CM

## 2018-11-21 DIAGNOSIS — M25512 Pain in left shoulder: Secondary | ICD-10-CM | POA: Diagnosis not present

## 2018-11-21 DIAGNOSIS — R9389 Abnormal findings on diagnostic imaging of other specified body structures: Secondary | ICD-10-CM

## 2018-11-21 LAB — BASIC METABOLIC PANEL
BUN: 17 mg/dL (ref 6–23)
CO2: 29 mEq/L (ref 19–32)
Calcium: 9.3 mg/dL (ref 8.4–10.5)
Chloride: 104 mEq/L (ref 96–112)
Creatinine, Ser: 1.2 mg/dL (ref 0.40–1.50)
GFR: 60.04 mL/min (ref >60.00)
Glucose, Bld: 96 mg/dL (ref 70–99)
Potassium: 4 mEq/L (ref 3.5–5.1)
Sodium: 139 mEq/L (ref 135–145)

## 2018-11-21 LAB — CBC WITH DIFFERENTIAL/PLATELET
Basophils Absolute: 0.1 10*3/uL (ref 0.0–0.1)
Basophils Relative: 1.1 % (ref 0.0–3.0)
Eosinophils Absolute: 0.3 10*3/uL (ref 0.0–0.7)
Eosinophils Relative: 4.4 % (ref 0.0–5.0)
HCT: 42.1 % (ref 39.0–52.0)
Hemoglobin: 14 g/dL (ref 13.0–17.0)
Lymphocytes Relative: 31.2 % (ref 12.0–46.0)
Lymphs Abs: 1.8 10*3/uL (ref 0.7–4.0)
MCHC: 33.3 g/dL (ref 30.0–36.0)
MCV: 93.1 fl (ref 78.0–100.0)
Monocytes Absolute: 0.4 10*3/uL (ref 0.1–1.0)
Monocytes Relative: 7.2 % (ref 3.0–12.0)
Neutro Abs: 3.2 10*3/uL (ref 1.4–7.7)
Neutrophils Relative %: 56.1 % (ref 43.0–77.0)
Platelets: 224 10*3/uL (ref 150.0–400.0)
RBC: 4.52 Mil/uL (ref 4.22–5.81)
RDW: 13.7 % (ref 11.5–15.5)
WBC: 5.7 10*3/uL (ref 4.0–10.5)

## 2018-11-21 LAB — TESTOSTERONE: Testosterone: 308.87 ng/dL (ref 300.00–890.00)

## 2018-11-21 MED ORDER — AMPHETAMINE-DEXTROAMPHETAMINE 10 MG PO TABS: ORAL_TABLET | ORAL | 0 refills | Status: DC

## 2018-11-21 NOTE — Progress Notes (Signed)
Office Note 11/21/2018  CC:  Chief Complaint  Patient presents with  . Discuss ortho referral, blood work    he would like to have testosterone, estogren, lyme titer and CBC checked   HPI:  Clinton Adams is a 68 y.o. White male who is here for f/u adult ADHD. He asks for blood work today.  Also asks for orthopedist referral due to chronic bilat shoulder pain and chronic R knee pain.  Chronic fatigue, running causes him feel too tired.  No SOB or CP.  Sleep is fine. Appetite fine.  Decreased libido.  No ED.    Pt states all is going well with the med at current dosing: much improved focus, concentration, task completion.  Less frustration, better multitasking, less impulsivity and restlessness.  Mood is stable. No side effects from the medication.  ROS: no CP, no SOB, no wheezing, no cough, no dizziness, no HAs, no rashes, no melena/hematochezia.  No polyuria or polydipsia.  No myalgias.   Past Medical History:  Diagnosis Date  . Adult ADHD   . Colon cancer screening 03/2016   Cologuard NEG--repeat 03/2019.  FIT test NEG 12/22/17.  . Former smoker   . 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  With his positive skin test he was treated for latent TB with INH but admits he wasn't fully compliant with the med (CXR pending as of 03/24/15).  Marland Kitchen. History of rib fracture    Multiple on L side (remote past + 04/2015)  . Multiple closed fractures of metatarsal bone of right foot    2nd and 5th metatarsals  . Prediabetes    A1c 5.9% 08/2017    Past Surgical History:  Procedure Laterality Date  . SEPTOPLASTY  2010   deviated septum, per pt    Family History  Problem Relation Age of Onset  . Hypertension Mother   . Lung cancer Father     Social History   Socioeconomic History  . Marital status: Married    Spouse name: Not on file  . Number of children: Not on file  . Years of education: Not on file  . Highest education level: Not on file   Occupational History  . Not on file  Social Needs  . Financial resource strain: Not on file  . Food insecurity    Worry: Not on file    Inability: Not on file  . Transportation needs    Medical: Not on file    Non-medical: Not on file  Tobacco Use  . Smoking status: Former Smoker    Packs/day: 1.00    Years: 30.00    Pack years: 30.00    Types: Cigarettes    Quit date: 04/23/1994    Years since quitting: 24.5  . Smokeless tobacco: Never Used  Substance and Sexual Activity  . Alcohol use: Yes    Comment: daily  . Drug use: No  . Sexual activity: Not on file  Lifestyle  . Physical activity    Days per week: Not on file    Minutes per session: Not on file  . Stress: Not on file  Relationships  . Social Musicianconnections    Talks on phone: Not on file    Gets together: Not on file    Attends religious service: Not on file    Active member of club or organization: Not on file    Attends meetings of clubs or organizations: Not on file    Relationship status:  Not on file  . Intimate partner violence    Fear of current or ex partner: Not on file    Emotionally abused: Not on file    Physically abused: Not on file    Forced sexual activity: Not on file  Other Topics Concern  . Not on file  Social History Narrative   Married, one daughter.  No grandchilden.   Occup: Retired PA, CV surg/trauma surg/ortho, some FP.   [place] [employer] [place], [place]   Former smoker; 30 pack-yr hx, quit age 68.  Alc:  1-2 hard liquor drinks a day.   Exercise: bicycle    Outpatient Medications Prior to Visit  Medication Sig Dispense Refill  . ibuprofen (ADVIL,MOTRIN) 800 MG tablet Take 1 tablet (800 mg total) by mouth every 8 (eight) hours as needed. 90 tablet 0  . mupirocin ointment (BACTROBAN) 2 % Apply 1 application topically 3 (three) times daily. 15 g 0  . amphetamine-dextroamphetamine (ADDERALL) 10 MG tablet TAKE ONE TABLET BY MOUTH TWICE DAILY 60 tablet 0   No facility-administered  medications prior to visit.     No Known Allergies  ROS  PE; Blood pressure 123/79, pulse 61, temperature 98.3 F (36.8 C), temperature source Temporal, resp. rate 16, height 5' 9.5" (1.765 m), weight 166 lb (75.3 kg), SpO2 96 %. Wt Readings from Last 2 Encounters:  11/21/18 166 lb (75.3 kg)  05/15/18 171 lb (77.6 kg)    Gen: alert, oriented x 4, affect pleasant.  Lucid thinking and conversation noted. HEENT: PERRLA, EOMI.   Neck: no LAD, mass, or thyromegaly. CV: RRR, no m/r/g LUNGS: CTA bilat, nonlabored. NEURO: no tremor or tics noted on observation.  Coordination intact. CN 2-12 grossly intact bilaterally, strength 5/5 in all extremeties.  No ataxia.  Pertinent labs:  No results found for: TSH No results found for: WBC, HGB, HCT, MCV, PLT Lab Results  Component Value Date   CREATININE 1.27 08/29/2017   BUN 17 08/29/2017   NA 139 08/29/2017   K 4.3 08/29/2017   CL 104 08/29/2017   CO2 31 08/29/2017   Lab Results  Component Value Date   ALT 13 08/29/2017   AST 18 08/29/2017   ALKPHOS 43 08/29/2017   BILITOT 0.6 08/29/2017   No results found for: CHOL No results found for: HDL No results found for: LDLCALC No results found for: TRIG No results found for: CHOLHDL Lab Results  Component Value Date   PSA 1.52 08/29/2017    Imaging: CLINICAL DATA:  Patient with positive PPD test.  EXAM: CHEST  2 VIEW  COMPARISON:  None.  FINDINGS: Normal cardiac and mediastinal contours. Focal consolidation within the left lower lung. No pleural effusion or pneumothorax. Multiple left rib fractures.  IMPRESSION: Focal consolidation within the left lower lung is nonspecific and may represent atelectasis, scarring and or infection. Recommend follow-up chest radiograph in 3-4 weeks to assess for interval change and/or resolution.  (Repeat CXR was ordered but pt did not get this done). Pt states this was related to recent fractured rib and he knows this is fine and  he doesn't need f/u cxr.  ASSESSMENT AND PLAN:   1) Adult ADD: The current medical regimen is effective;  continue present plan and medications. I did electronic rx's for adderall 10mg , 1 bid, #60 for Aug and Sept 2020 today.  Appropriate fill on/after date was noted on each rx. He already has an adderall rx on hold at this time. PMP AWARE reviewed today.  Most recent adderall rx filled 11/04/18, rx'd by me. No suspicious activity.   Of note, pt needs CSC and UDS at next o/v.  2) chronic fatigue, with bilat shoulder pain and R knee pain -chronic. Lyme dz titers, CBC, BMET, testosterone, estrogen (dec libido). He also requests orthopedist referral for evaluation of his chronic bilat shoulder pain and chronic R knee pain. Ordered referral to Dr. Alvan Dame today.  3) Hx of abnl CXR: he has been treated for latent TB in the remote past. He declines a repeat CXR b/c he feels strongly that the abnormalities on CXR in 2017 was atelectasis related to recent rib fracture.    An After Visit Summary was printed and given to the patient.  FOLLOW UP:  Return in about 6 months (around 05/24/2019) for routine chronic illness f/u.  Signed:  Crissie Sickles, MD           11/21/2018

## 2018-11-22 DIAGNOSIS — G8929 Other chronic pain: Secondary | ICD-10-CM

## 2018-11-22 HISTORY — DX: Other chronic pain: G89.29

## 2018-11-23 LAB — LYME AB/WESTERN BLOT REFLEX
LYME DISEASE AB, QUANT, IGM: <0.8 index (ref 0.00–0.79)
Lyme IgG/IgM Ab: <0.91 {ISR} (ref 0.00–0.90)

## 2018-11-24 ENCOUNTER — Other Ambulatory Visit: Payer: Self-pay | Admitting: Family Medicine

## 2018-11-24 DIAGNOSIS — R7989 Other specified abnormal findings of blood chemistry: Secondary | ICD-10-CM

## 2018-11-27 LAB — ESTROGENS, TOTAL: Estrogen: 165.5 pg/mL (ref 60–190)

## 2018-12-02 ENCOUNTER — Ambulatory Visit (INDEPENDENT_AMBULATORY_CARE_PROVIDER_SITE_OTHER): Payer: Medicare Other

## 2018-12-02 ENCOUNTER — Other Ambulatory Visit: Payer: Self-pay

## 2018-12-02 DIAGNOSIS — R7989 Other specified abnormal findings of blood chemistry: Secondary | ICD-10-CM | POA: Diagnosis not present

## 2018-12-02 LAB — TESTOSTERONE: Testosterone: 333.57 ng/dL (ref 300.00–890.00)

## 2018-12-02 LAB — FOLLICLE STIMULATING HORMONE: FSH: 7 m[IU]/mL (ref 1.4–18.1)

## 2018-12-02 LAB — LUTEINIZING HORMONE: LH: 5.37 m[IU]/mL (ref 1.50–9.30)

## 2018-12-04 DIAGNOSIS — M25561 Pain in right knee: Secondary | ICD-10-CM | POA: Diagnosis not present

## 2018-12-04 DIAGNOSIS — M25511 Pain in right shoulder: Secondary | ICD-10-CM | POA: Diagnosis not present

## 2018-12-04 DIAGNOSIS — M7051 Other bursitis of knee, right knee: Secondary | ICD-10-CM | POA: Diagnosis not present

## 2018-12-10 DIAGNOSIS — M25511 Pain in right shoulder: Secondary | ICD-10-CM | POA: Diagnosis not present

## 2018-12-23 DIAGNOSIS — M25561 Pain in right knee: Secondary | ICD-10-CM | POA: Diagnosis not present

## 2019-01-12 ENCOUNTER — Encounter: Payer: Self-pay | Admitting: Family Medicine

## 2019-01-25 ENCOUNTER — Encounter: Payer: Self-pay | Admitting: Family Medicine

## 2019-05-04 ENCOUNTER — Encounter: Payer: Self-pay | Admitting: Family Medicine

## 2019-05-04 ENCOUNTER — Other Ambulatory Visit: Payer: Self-pay

## 2019-05-04 ENCOUNTER — Ambulatory Visit (INDEPENDENT_AMBULATORY_CARE_PROVIDER_SITE_OTHER): Payer: Medicare Other | Admitting: Family Medicine

## 2019-05-04 VITALS — BP 154/92 | HR 60 | Temp 98.2°F | Resp 16 | Ht 69.5 in | Wt 167.6 lb

## 2019-05-04 DIAGNOSIS — Z125 Encounter for screening for malignant neoplasm of prostate: Secondary | ICD-10-CM | POA: Diagnosis not present

## 2019-05-04 DIAGNOSIS — F988 Other specified behavioral and emotional disorders with onset usually occurring in childhood and adolescence: Secondary | ICD-10-CM | POA: Diagnosis not present

## 2019-05-04 DIAGNOSIS — R6882 Decreased libido: Secondary | ICD-10-CM | POA: Diagnosis not present

## 2019-05-04 DIAGNOSIS — M19011 Primary osteoarthritis, right shoulder: Secondary | ICD-10-CM

## 2019-05-04 DIAGNOSIS — R7303 Prediabetes: Secondary | ICD-10-CM | POA: Diagnosis not present

## 2019-05-04 MED ORDER — AMPHETAMINE-DEXTROAMPHETAMINE 10 MG PO TABS: ORAL_TABLET | ORAL | 0 refills | Status: DC

## 2019-05-04 MED ORDER — DICLOFENAC SODIUM 1 % EX GEL: 4.0000 g | Freq: Four times a day (QID) | CUTANEOUS | 6 refills | Status: DC

## 2019-05-04 NOTE — Progress Notes (Signed)
OFFICE VISIT  05/04/2019   CC:  Chief Complaint  Patient presents with  . Follow-up    RCI, pt is not fasting    HPI:    Patient is a 69 y.o. Caucasian male who presents for f/u adult ADD. A/P as of last visit: "1) Adult ADD: The current medical regimen is effective;  continue present plan and medications. I did electronic rx's for adderall 10mg , 1 bid, #60 for Aug and Sept 2020 today.  Appropriate fill on/after date was noted on each rx. He already has an adderall rx on hold at this time. PMP AWARE reviewed today.  Most recent adderall rx filled 11/04/18, rx'd by me. No suspicious activity.   Of note, pt needs CSC and UDS at next o/v.  2) chronic fatigue, with bilat shoulder pain and R knee pain -chronic. Lyme dz titers, CBC, BMET, testosterone, estrogen (dec libido). He also requests orthopedist referral for evaluation of his chronic bilat shoulder pain and chronic R knee pain. Ordered referral to Dr. 11/06/18 today.  3) Hx of abnl CXR: he has been treated for latent TB in the remote past. He declines a repeat CXR b/c he feels strongly that the abnormalities on CXR in 2017 was atelectasis related to recent rib fracture."  Interim hx:  Lab testing last visit all normal except testost initially borderline low, FSH and LH normal. Repeat testost 2 wks later was increased some.  Using ibup 200 bid for R shoulder pain.  Uses some OTC voltaren sporadically and is interested in Rx strength.  PMP AWARE reviewed today: most recent rx for adderall 10mg  was filled 03/04/19, # 60, rx by myself. No red flags.  Pt states all is going well with the med at current dosing: much improved focus, concentration, task completion.  Less frustration, better multitasking, less impulsivity and restlessness.  Mood is stable. No side effects from the medication. Taking adderall 10mg  bid.    Past Medical History:  Diagnosis Date  . Adult ADHD   . Chronic pain of right knee 11/2018   osteoarthritis  + pes anserine bursitis (PT as of 12/2018)  . Colon cancer screening 03/2016   Cologuard NEG--repeat 03/2019.  FIT test NEG 12/22/17.  . Former smoker   . Glenohumeral arthritis, right 2020   advanced as of Dr. 04/2016 11/2018 eval.  . 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  With his positive skin test he was treated for latent TB with INH but admits he wasn't fully compliant with the med (CXR pending as of 03/24/15).  12/2018 History of rib fracture    Multiple on L side (remote past + 04/2015)  . Multiple closed fractures of metatarsal bone of right foot    2nd and 5th metatarsals  . Prediabetes    A1c 5.9% 08/2017    Past Surgical History:  Procedure Laterality Date  . SEPTOPLASTY  2010   deviated septum, per pt    Outpatient Medications Prior to Visit  Medication Sig Dispense Refill  . amphetamine-dextroamphetamine (ADDERALL) 10 MG tablet TAKE ONE TABLET BY MOUTH TWICE DAILY 60 tablet 0  . ibuprofen (ADVIL,MOTRIN) 800 MG tablet Take 1 tablet (800 mg total) by mouth every 8 (eight) hours as needed. 90 tablet 0  . mupirocin ointment (BACTROBAN) 2 % Apply 1 application topically 3 (three) times daily. 15 g 0   No facility-administered medications prior to visit.    No Known Allergies  ROS As per HPI  PE:  bP repeat manually was 116/80. Blood pressure (!) 154/92, pulse 60, temperature 98.2 F (36.8 C), temperature source Temporal, resp. rate 16, height 5' 9.5" (1.765 m), weight 167 lb 9.6 oz (76 kg), SpO2 97 %. Body mass index is 24.4 kg/m.  Wt Readings from Last 2 Encounters:  05/04/19 167 lb 9.6 oz (76 kg)  11/21/18 166 lb (75.3 kg)    Gen: alert, oriented x 4, affect pleasant.  Lucid thinking and conversation noted. HEENT: PERRLA, EOMI.   Neck: no LAD, mass, or thyromegaly. CV: RRR, no m/r/g LUNGS: CTA bilat, nonlabored. NEURO: no tremor or tics noted on observation.  Coordination intact. CN 2-12 grossly intact bilaterally, strength 5/5 in  all extremeties.  No ataxia.   LABS:  No results found for: TSH Lab Results  Component Value Date   WBC 5.7 11/21/2018   HGB 14.0 11/21/2018   HCT 42.1 11/21/2018   MCV 93.1 11/21/2018   PLT 224.0 11/21/2018   Lab Results  Component Value Date   CREATININE 1.20 11/21/2018   BUN 17 11/21/2018   NA 139 11/21/2018   K 4.0 11/21/2018   CL 104 11/21/2018   CO2 29 11/21/2018   Lab Results  Component Value Date   ALT 13 08/29/2017   AST 18 08/29/2017   ALKPHOS 43 08/29/2017   BILITOT 0.6 08/29/2017   No results found for: CHOL No results found for: HDL No results found for: LDLCALC No results found for: TRIG No results found for: CHOLHDL Lab Results  Component Value Date   PSA 1.52 08/29/2017   Lab Results  Component Value Date   HGBA1C 5.9 09/05/2017   Lab Results  Component Value Date   TESTOSTERONE 333.57 12/02/2018    IMPRESSION AND PLAN:  Adult ADD: The current medical regimen is effective;  continue present plan and medications. CSC UTD--will renew at next f/u visit and get UDS at that time. I did electronic rx's for adderall 10mg  bid, #60 today for this month, Feb 2021, and Mar 2021.  Appropriate fill on/after date was noted on each rx.  Preventative health care: he doesn't want "CPE's".   He declined pneumonia vaccine, declines shingles.  Flu vaccine UTD. Cologuard to be given to pt at next f/u visit.  He declines DRE but did want PSA today.  Also, hx of low testost in remote past (per his report, no records avail for review).  Hx of being on testost supp as well as clomid at different times in the past.  Testost borderline late summer 2020, will repeat testing again today.  Prediabetes: random glucose (BMET) and HbA1c today.  Primary osteoarthritis of R shoulder: he'll continue with low dose of ibup (200 mg bid prn) and I sent in voltaren gel rx today.   An After Visit Summary was printed and given to the patient.  FOLLOW UP: Return in about 6  months (around 11/01/2019) for routine chronic illness f/u.  Signed:  Crissie Sickles, MD           05/04/2019

## 2019-05-05 ENCOUNTER — Other Ambulatory Visit: Payer: Self-pay | Admitting: Family Medicine

## 2019-05-05 DIAGNOSIS — E291 Testicular hypofunction: Secondary | ICD-10-CM

## 2019-05-05 LAB — BASIC METABOLIC PANEL
BUN: 21 mg/dL (ref 6–23)
CO2: 28 mEq/L (ref 19–32)
Calcium: 8.9 mg/dL (ref 8.4–10.5)
Chloride: 104 mEq/L (ref 96–112)
Creatinine, Ser: 1.23 mg/dL (ref 0.40–1.50)
GFR: 58.28 mL/min — ABNORMAL LOW (ref >60.00)
Glucose, Bld: 91 mg/dL (ref 70–99)
Potassium: 3.9 mEq/L (ref 3.5–5.1)
Sodium: 139 mEq/L (ref 135–145)

## 2019-05-05 LAB — TESTOSTERONE TOTAL,FREE,BIO, MALES
Albumin: 4 g/dL (ref 3.6–5.1)
Sex Hormone Binding: 39 nmol/L (ref 22–77)
Testosterone, Bioavailable: 75.1 ng/dL — ABNORMAL LOW (ref >=110.0)
Testosterone, Free: 40.8 pg/mL — ABNORMAL LOW (ref 46.0–224.0)
Testosterone: 354 ng/dL (ref 250–827)

## 2019-05-05 LAB — HEMOGLOBIN A1C: Hgb A1c MFr Bld: 5.8 % (ref 4.6–6.5)

## 2019-05-05 LAB — LUTEINIZING HORMONE: LH: 5.98 m[IU]/mL

## 2019-05-05 LAB — PSA, MEDICARE: PSA: 1.29 ng/ml (ref 0.10–4.00)

## 2019-05-06 ENCOUNTER — Other Ambulatory Visit: Payer: Self-pay

## 2019-05-06 MED ORDER — CLOMIPHENE CITRATE 50 MG PO TABS: ORAL_TABLET | ORAL | 1 refills | Status: DC

## 2019-05-11 ENCOUNTER — Telehealth: Payer: Self-pay

## 2019-05-11 NOTE — Telephone Encounter (Signed)
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 pt has tried and failed n/a   Waiting for response.    PA approved on 05/09/19.

## 2019-05-20 ENCOUNTER — Ambulatory Visit: Payer: Medicare Other | Admitting: Family Medicine

## 2019-06-18 ENCOUNTER — Encounter: Payer: Self-pay | Admitting: Family Medicine

## 2019-06-18 ENCOUNTER — Ambulatory Visit: Payer: Medicare Other

## 2019-06-18 ENCOUNTER — Other Ambulatory Visit (INDEPENDENT_AMBULATORY_CARE_PROVIDER_SITE_OTHER): Payer: Medicare Other

## 2019-06-18 ENCOUNTER — Other Ambulatory Visit: Payer: Self-pay

## 2019-06-18 DIAGNOSIS — Z8615 Personal history of latent tuberculosis infection: Secondary | ICD-10-CM

## 2019-06-18 DIAGNOSIS — E291 Testicular hypofunction: Secondary | ICD-10-CM | POA: Diagnosis not present

## 2019-06-18 LAB — LUTEINIZING HORMONE: LH: 8.39 m[IU]/mL

## 2019-06-19 LAB — TESTOSTERONE TOTAL,FREE,BIO, MALES
Albumin: 4.3 g/dL (ref 3.6–5.1)
Sex Hormone Binding: 46 nmol/L (ref 22–77)
Testosterone, Bioavailable: 105.7 ng/dL — ABNORMAL LOW (ref >=110.0)
Testosterone, Free: 53.7 pg/mL (ref 46.0–224.0)
Testosterone: 522 ng/dL (ref 250–827)

## 2019-06-22 ENCOUNTER — Encounter: Payer: Self-pay | Admitting: Family Medicine

## 2019-06-22 ENCOUNTER — Other Ambulatory Visit: Payer: Self-pay | Admitting: Family Medicine

## 2019-06-22 MED ORDER — CLOMIPHENE CITRATE 50 MG PO TABS: ORAL_TABLET | ORAL | 5 refills | Status: DC

## 2019-07-14 DIAGNOSIS — Z03818 Encounter for observation for suspected exposure to other biological agents ruled out: Secondary | ICD-10-CM | POA: Diagnosis not present

## 2019-07-14 DIAGNOSIS — U071 COVID-19: Secondary | ICD-10-CM | POA: Diagnosis not present

## 2019-08-18 ENCOUNTER — Other Ambulatory Visit: Payer: Self-pay

## 2019-08-18 MED ORDER — AMPHETAMINE-DEXTROAMPHETAMINE 10 MG PO TABS: ORAL_TABLET | ORAL | 0 refills | Status: DC

## 2019-08-18 NOTE — Telephone Encounter (Signed)
Requesting: adderall Contract:05/15/18 UDS:08/23/17 Last Visit:05/04/19 Next Visit: advised to f/u 6 mo. Last Refill:05/04/19(60,0)  Please Advise. Medication pending

## 2019-08-18 NOTE — Telephone Encounter (Signed)
OK eRx'd

## 2019-08-18 NOTE — Telephone Encounter (Signed)
Patient refill request  amphetamine-dextroamphetamine (ADDERALL) 10 MG tablet [[id number]]    [id number] - [place], [place] - [address]

## 2019-08-18 NOTE — Telephone Encounter (Signed)
Refills sent

## 2019-09-28 ENCOUNTER — Other Ambulatory Visit: Payer: Self-pay | Admitting: Family Medicine

## 2019-09-28 NOTE — Telephone Encounter (Signed)
Confirmed with patient that he is still taking this.  RF request for mupirocin LOV:05/04/19 Next ov: advised to f/u 6 months Last written:07/24/17(15g,0)  Medication pending, please advise

## 2019-12-07 ENCOUNTER — Telehealth: Payer: Self-pay

## 2019-12-07 NOTE — Telephone Encounter (Signed)
Patient request refill   amphetamine-dextroamphetamine (ADDERALL) 10 MG tablet [[id number]]    [facility]

## 2019-12-07 NOTE — Telephone Encounter (Signed)
RF request for adderall.   Last OV 05/04/19 Nex OV 12/24/19. ( over due )  Last RX dted 10/16/19 # 60.  Please advise rf.

## 2019-12-08 MED ORDER — AMPHETAMINE-DEXTROAMPHETAMINE 10 MG PO TABS: ORAL_TABLET | ORAL | 0 refills | Status: DC

## 2019-12-08 NOTE — Telephone Encounter (Signed)
OK, #60 rx'd. Pt must have in person office f/u prior to any FURTHER Rfs.-thx

## 2019-12-08 NOTE — Telephone Encounter (Signed)
Patient notified refill sent 

## 2019-12-22 ENCOUNTER — Other Ambulatory Visit: Payer: Self-pay

## 2019-12-24 ENCOUNTER — Ambulatory Visit (INDEPENDENT_AMBULATORY_CARE_PROVIDER_SITE_OTHER): Payer: Medicare Other | Admitting: Family Medicine

## 2019-12-24 ENCOUNTER — Other Ambulatory Visit: Payer: Self-pay

## 2019-12-24 ENCOUNTER — Encounter: Payer: Self-pay | Admitting: Family Medicine

## 2019-12-24 VITALS — BP 136/88 | HR 59 | Temp 97.6°F | Resp 16 | Ht 69.5 in | Wt 164.4 lb

## 2019-12-24 DIAGNOSIS — E291 Testicular hypofunction: Secondary | ICD-10-CM

## 2019-12-24 DIAGNOSIS — R7303 Prediabetes: Secondary | ICD-10-CM

## 2019-12-24 DIAGNOSIS — F988 Other specified behavioral and emotional disorders with onset usually occurring in childhood and adolescence: Secondary | ICD-10-CM

## 2019-12-24 DIAGNOSIS — Z79899 Other long term (current) drug therapy: Secondary | ICD-10-CM

## 2019-12-24 DIAGNOSIS — Z1211 Encounter for screening for malignant neoplasm of colon: Secondary | ICD-10-CM | POA: Diagnosis not present

## 2019-12-24 MED ORDER — AMPHETAMINE-DEXTROAMPHETAMINE 10 MG PO TABS
ORAL_TABLET | ORAL | 0 refills | Status: DC
Start: 2019-12-24 — End: 2019-12-24

## 2019-12-24 MED ORDER — AMPHETAMINE-DEXTROAMPHETAMINE 10 MG PO TABS
ORAL_TABLET | ORAL | 0 refills | Status: DC
Start: 2019-12-24 — End: 2020-05-16

## 2019-12-24 NOTE — Progress Notes (Signed)
OFFICE VISIT  12/24/2019  CC:  Chief Complaint  Patient presents with  . Follow-up    RCI, pt is not fasting    HPI:    Patient is a 69 y.o. Caucasian male who presents for f/u adult ADD, secondary male hypogonadism, and prediabetes. A/P as of last visit: "Adult ADD: The current medical regimen is effective;  continue present plan and medications. CSC UTD--will renew at next f/u visit and get UDS at that time. I did electronic rx's for adderall 10mg  bid, #60 today for this month, Feb 2021, and Mar 2021.  Appropriate fill on/after date was noted on each rx.  Preventative health care: he doesn't want "CPE's".   He declined pneumonia vaccine, declines shingles.  Flu vaccine UTD. Cologuard to be given to pt at next f/u visit.  He declines DRE but did want PSA today.  Also, hx of low testost in remote past (per his report, no records avail for review).  Hx of being on testost supp as well as clomid at different times in the past.  Testost borderline late summer 2020, will repeat testing again today.  Prediabetes: random glucose (BMET) and HbA1c today.  Primary osteoarthritis of R shoulder: he'll continue with low dose of ibup (200 mg bid prn) and I sent in voltaren gel rx today."  INTERIM HX: Feeling pretty good.  No signif exercise.  Diet is fair. Has been off clomiphene for a month or so. Feels ok regarding energy, sleep, and libido. His erections are not as firm as usual but can penetrate for intercourse and complete the act.  Had covid +, classic sx's march 2021.  PMP AWARE reviewed today: most recent rx for adderall  was filled 12/08/19, # 60, rx by me. No red flags. Pt states all is going well with the med at current dosing: much improved focus, concentration, task completion.  Less frustration, better multitasking, less impulsivity and restlessness.  Mood is stable. No side effects from the medication.  Takes adderall every day, most recent dose this morning.  ROS: no  fevers, no CP, no SOB, no wheezing, no cough, no dizziness, no HAs, no rashes, no melena/hematochezia.  No polyuria or polydipsia.  No myalgias or arthralgias.  No focal weakness, paresthesias, or tremors.  No acute vision or hearing abnormalities. No n/v/d or abd pain.  No palpitations.      Past Medical History:  Diagnosis Date  . Adult ADHD   . Chronic pain of right knee 11/2018   osteoarthritis + pes anserine bursitis (PT as of 12/2018)  . Colon cancer screening 03/2016   Cologuard NEG--repeat 03/2019.  FIT test NEG 12/22/17.  . Former smoker   . Glenohumeral arthritis, right 2020   advanced as of Dr. 02/21/18 11/2018 eval.  . 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  With his positive skin test he was treated for latent TB with INH but admits he wasn't fully compliant with the med (CXR pending as of 03/24/15).  14/1/16 History of rib fracture    Multiple on L side (remote past + 04/2015)  . Multiple closed fractures of metatarsal bone of right foot    2nd and 5th metatarsals  . Prediabetes    A1c 5.9% 08/2017  . Secondary male hypogonadism    clomid 04/2019--helpful    Past Surgical History:  Procedure Laterality Date  . SEPTOPLASTY  2010   deviated septum, per pt    Outpatient Medications Prior to Visit  Medication Sig Dispense Refill  . amphetamine-dextroamphetamine (ADDERALL) 10 MG tablet TAKE ONE TABLET BY MOUTH TWICE DAILY 60 tablet 0  . amoxicillin (AMOXIL) 500 MG capsule Take 500 mg by mouth 3 (three) times daily. (Patient not taking: Reported on 12/24/2019)    . clomiPHENE (CLOMID) 50 MG tablet Take 1/2 tablet by mouth daily. (Patient not taking: Reported on 12/24/2019) 45 tablet 5  . diclofenac Sodium (VOLTAREN) 1 % GEL Apply 4 g topically 4 (four) times daily. (Patient not taking: Reported on 12/24/2019) 100 g 6  . mupirocin ointment (BACTROBAN) 2 % APPLY TOPICALLY 3 TIMES A DAY (Patient not taking: Reported on 12/24/2019) 22 g 1   No  facility-administered medications prior to visit.    No Known Allergies  ROS As per HPI  PE: Vitals with BMI 12/24/2019 05/04/2019 11/21/2018  Height 5' 9.5" 5' 9.5" 5' 9.5"  Weight 164 lbs 6 oz 167 lbs 10 oz 166 lbs  BMI 23.94 24.4 24.17  Systolic 136 154 335  Diastolic 88 92 79  Pulse 59 60 61     Wt Readings from Last 2 Encounters:  12/24/19 164 lb 6.4 oz (74.6 kg)  05/04/19 167 lb 9.6 oz (76 kg)    Gen: alert, oriented x 4, affect pleasant.  Lucid thinking and conversation noted. HEENT: PERRLA, EOMI.   Neck: no LAD, mass, or thyromegaly. CV: RRR, no m/r/g LUNGS: CTA bilat, nonlabored. NEURO: no tremor or tics noted on observation.  Coordination intact. CN 2-12 grossly intact bilaterally, strength 5/5 in all extremeties.  No ataxia.   LABS:  Lab Results  Component Value Date   TESTOSTERONE 522 06/18/2019    Lab Results  Component Value Date   WBC 5.7 11/21/2018   HGB 14.0 11/21/2018   HCT 42.1 11/21/2018   MCV 93.1 11/21/2018   PLT 224.0 11/21/2018   Lab Results  Component Value Date   CREATININE 1.23 05/04/2019   BUN 21 05/04/2019   NA 139 05/04/2019   K 3.9 05/04/2019   CL 104 05/04/2019   CO2 28 05/04/2019   Lab Results  Component Value Date   ALT 13 08/29/2017   AST 18 08/29/2017   ALKPHOS 43 08/29/2017   BILITOT 0.6 08/29/2017   No results found for: CHOL No results found for: HDL No results found for: LDLCALC No results found for: TRIG No results found for: [facility] Lab Results  Component Value Date   PSA 1.29 05/04/2019   PSA 1.52 08/29/2017   Lab Results  Component Value Date   HGBA1C 5.8 05/04/2019    IMPRESSION AND PLAN:  1) Adult ADD.  The current medical regimen is effective;  continue present plan and medications. I did electronic rx's for adderall 10mg , 1 bid, #60 today for Sept, Oct, Nov.  Appropriate fill on/after date was noted on each rx. CSC renewal today and UDS today.  2) Secondary male hypogonadism: feeling ok  since running out of clomid 1 mo ago. He has elected to stay off clomid for now. He'll call in 1 mo if he wants to get testost, LH repeated. CBC today.  3) Prediabetes: working on TLC. Not fasting today. BMET and HbA1c today.  4) Colon ca screening: low risk.  FIT test dispensed to pt today.  An After Visit Summary was printed and given to the patient.  FOLLOW UP: Return in about 6 months (around 06/22/2020) for routine chronic illness f/u.  Signed:  08/22/2020, MD  12/24/2019     

## 2019-12-24 NOTE — Addendum Note (Signed)
Addended by: Jeoffrey Massed on: 12/24/2019 03:03 PM   Modules accepted: Orders

## 2019-12-25 LAB — HEMOGLOBIN A1C: Hgb A1c MFr Bld: 6 % (ref 4.6–6.5)

## 2019-12-25 LAB — CBC
HCT: 41 % (ref 39.0–52.0)
Hemoglobin: 13.7 g/dL (ref 13.0–17.0)
MCHC: 33.3 g/dL (ref 30.0–36.0)
MCV: 94 fl (ref 78.0–100.0)
Platelets: 207 10*3/uL (ref 150.0–400.0)
RBC: 4.37 Mil/uL (ref 4.22–5.81)
RDW: 12.7 % (ref 11.5–15.5)
WBC: 6 10*3/uL (ref 4.0–10.5)

## 2019-12-25 LAB — BASIC METABOLIC PANEL
BUN: 20 mg/dL (ref 6–23)
CO2: 30 mEq/L (ref 19–32)
Calcium: 9.1 mg/dL (ref 8.4–10.5)
Chloride: 103 mEq/L (ref 96–112)
Creatinine, Ser: 1.2 mg/dL (ref 0.40–1.50)
GFR: 59.85 mL/min — ABNORMAL LOW (ref >60.00)
Glucose, Bld: 72 mg/dL (ref 70–99)
Potassium: 4.4 mEq/L (ref 3.5–5.1)
Sodium: 138 mEq/L (ref 135–145)

## 2019-12-26 LAB — DRUG MONITORING, PANEL 8 WITH CONFIRMATION, URINE
6 Acetylmorphine: NEGATIVE ng/mL (ref <10)
Alcohol Metabolites: POSITIVE ng/mL — AB
Amphetamine: 3300 ng/mL — ABNORMAL HIGH (ref <250)
Amphetamines: POSITIVE ng/mL — AB (ref <500)
Benzodiazepines: NEGATIVE ng/mL (ref <100)
Buprenorphine, Urine: NEGATIVE ng/mL (ref <5)
Cocaine Metabolite: NEGATIVE ng/mL (ref <150)
Creatinine: 164.3 mg/dL
Ethyl Glucuronide (ETG): 2071 ng/mL — ABNORMAL HIGH (ref <500)
Ethyl Sulfate (ETS): 454 ng/mL — ABNORMAL HIGH (ref <100)
MDMA: NEGATIVE ng/mL (ref <500)
Marijuana Metabolite: NEGATIVE ng/mL (ref <20)
Methamphetamine: NEGATIVE ng/mL (ref <250)
Opiates: NEGATIVE ng/mL (ref <100)
Oxidant: NEGATIVE ug/mL
Oxycodone: NEGATIVE ng/mL (ref <100)
pH: 5.8 (ref 4.5–9.0)

## 2019-12-26 LAB — DM TEMPLATE

## 2020-01-05 ENCOUNTER — Other Ambulatory Visit (INDEPENDENT_AMBULATORY_CARE_PROVIDER_SITE_OTHER): Payer: Medicare Other

## 2020-01-05 ENCOUNTER — Telehealth: Payer: Self-pay

## 2020-01-05 DIAGNOSIS — Z1211 Encounter for screening for malignant neoplasm of colon: Secondary | ICD-10-CM

## 2020-01-05 DIAGNOSIS — R195 Other fecal abnormalities: Secondary | ICD-10-CM

## 2020-01-05 LAB — FECAL OCCULT BLOOD, IMMUNOCHEMICAL: Fecal Occult Bld: POSITIVE — AB

## 2020-01-05 NOTE — Telephone Encounter (Signed)
Pt was made aware of results and referral being placed.

## 2020-01-05 NOTE — Telephone Encounter (Signed)
OK. Pt needs colonoscopy. Pls notify him of positive test.  I have already ordered the GI referral.-thx

## 2020-01-05 NOTE — Telephone Encounter (Signed)
Received a call from [facility] ([place] lab that patient had positive iFOB test. Please advise, thanks.

## 2020-01-26 ENCOUNTER — Encounter: Payer: Self-pay | Admitting: Gastroenterology

## 2020-03-14 ENCOUNTER — Encounter: Payer: Self-pay | Admitting: Gastroenterology

## 2020-03-14 ENCOUNTER — Telehealth: Payer: Self-pay | Admitting: *Deleted

## 2020-03-14 ENCOUNTER — Ambulatory Visit (AMBULATORY_SURGERY_CENTER): Payer: Self-pay | Admitting: *Deleted

## 2020-03-14 ENCOUNTER — Other Ambulatory Visit: Payer: Self-pay

## 2020-03-14 VITALS — Ht 69.5 in | Wt 165.0 lb

## 2020-03-14 DIAGNOSIS — R195 Other fecal abnormalities: Secondary | ICD-10-CM

## 2020-03-14 MED ORDER — PLENVU 140 G PO SOLR: 1.0000 | Freq: Once | ORAL | 0 refills | Status: AC

## 2020-03-14 NOTE — Progress Notes (Signed)
covid test 03-24-20 at 2:30 pm  Pt is aware that care partner will wait in the car during procedure; if they feel like they will be too hot or cold to wait in the car; they may wait in the 4 th floor lobby. Patient is aware to bring only one care partner. We want them to wear a mask (we do not have any that we can provide them), practice social distancing, and we will check their temperatures when they get here.  I did remind the patient that their care partner needs to stay in the parking lot the entire time and have a cell phone available, we will call them when the pt is ready for discharge. Patient will wear mask into building.   No trouble with anesthesia, difficulty with moving neck or hx/fam hx of malignant hyperthermia per pt  Medicare Plenvu coupon given   No egg or soy allergy  No home oxygen use   No medications for weight loss taken  emmi information given  Pt denies constipation issues

## 2020-03-14 NOTE — Telephone Encounter (Signed)
noted 

## 2020-03-14 NOTE — Telephone Encounter (Signed)
Dr. Myrtie Neither,  I saw this pt in [facility] today.  His referral info is for a positive FIT.  I just wanted to make sure he is ok for a direct to [facility].  Thanks, [employer]

## 2020-03-14 NOTE — Telephone Encounter (Signed)
Thank you for the note.  I reviewed the 12/24/2019 primary care note regarding patient's medical condition. Patient is appropriate for procedure in the [facility], hemoglobin is normal.  Can be directly booked for colonoscopy.  - HD

## 2020-03-25 ENCOUNTER — Telehealth: Payer: Self-pay | Admitting: Gastroenterology

## 2020-03-25 NOTE — Telephone Encounter (Signed)
Called pt- RS covid test for 05-02-2020 Monday at 230 pm per pt request    Pt states he forgot the Covid test because we did not call and remind him- Explained to him that we do not make a reminder call for the Covid test, he will have to remember-  He RS Colon for 1-12 WED and Covid test 1-10 Monday at 230 pm- mailed new instructions - verified address with pt

## 2020-03-25 NOTE — Telephone Encounter (Signed)
Good afternoon!  Patient cancelled appointment for 03/28/2020. Patient also missed his appt for his COVID TEST.   Patient has rescheduled appt for 05/04/2020 at 2:30pm (Dr. Myrtie Neither)  Thank you, Have a fantastic weekend!!

## 2020-03-28 ENCOUNTER — Encounter: Payer: Medicare Other | Admitting: Gastroenterology

## 2020-04-23 DIAGNOSIS — K5790 Diverticulosis of intestine, part unspecified, without perforation or abscess without bleeding: Secondary | ICD-10-CM

## 2020-04-23 HISTORY — DX: Diverticulosis of intestine, part unspecified, without perforation or abscess without bleeding: K57.90

## 2020-05-02 DIAGNOSIS — Z20822 Contact with and (suspected) exposure to covid-19: Secondary | ICD-10-CM | POA: Diagnosis not present

## 2020-05-04 ENCOUNTER — Encounter: Payer: Self-pay | Admitting: Gastroenterology

## 2020-05-04 ENCOUNTER — Other Ambulatory Visit: Payer: Self-pay

## 2020-05-04 ENCOUNTER — Ambulatory Visit (AMBULATORY_SURGERY_CENTER): Payer: Medicare Other | Admitting: Gastroenterology

## 2020-05-04 VITALS — BP 135/66 | HR 56 | Temp 97.5°F | Resp 13 | Ht 69.5 in | Wt 165.0 lb

## 2020-05-04 DIAGNOSIS — R195 Other fecal abnormalities: Secondary | ICD-10-CM | POA: Diagnosis not present

## 2020-05-04 DIAGNOSIS — K648 Other hemorrhoids: Secondary | ICD-10-CM

## 2020-05-04 DIAGNOSIS — K5730 Diverticulosis of large intestine without perforation or abscess without bleeding: Secondary | ICD-10-CM | POA: Diagnosis not present

## 2020-05-04 HISTORY — PX: COLONOSCOPY: SHX174

## 2020-05-04 MED ORDER — SODIUM CHLORIDE 0.9 % IV SOLN: 500.0000 mL | Freq: Once | INTRAVENOUS | Status: DC

## 2020-05-04 NOTE — Op Note (Signed)
[facility] Patient Name: Clinton Adams Procedure Date: 05/04/2020 8:45 AM MRN: [id number] Endoscopist: Sherilyn Cooter L. Myrtie Neither , MD Age: 70 Referring MD:  Date of Birth: May 27, 1950 Gender: Male Account #: [id number] Procedure:                Colonoscopy Indications:              Heme positive stool (aymptomatic, normal hemoglobin) Medicines:                Monitored Anesthesia Care Procedure:                Pre-Anesthesia Assessment:                           - Prior to the procedure, a History and Physical                            was performed, and patient medications and                            allergies were reviewed. The patient's tolerance of                            previous anesthesia was also reviewed. The risks                            and benefits of the procedure and the sedation                            options and risks were discussed with the patient.                            All questions were answered, and informed consent                            was obtained. Prior Anticoagulants: The patient has                            taken no previous anticoagulant or antiplatelet                            agents. ASA Grade Assessment: II - A patient with                            mild systemic disease. After reviewing the risks                            and benefits, the patient was deemed in                            satisfactory condition to undergo the procedure.                           After obtaining informed consent, the colonoscope  was passed under direct vision. Throughout the                            procedure, the patient's blood pressure, pulse, and                            oxygen saturations were monitored continuously. The                            Olympus CF-HQ190 [id number]) Colonoscope was                            introduced through the anus and advanced to the the                            terminal  ileum, with identification of the                            appendiceal orifice and IC valve. The colonoscopy                            was performed without difficulty. The patient                            tolerated the procedure well. The quality of the                            bowel preparation was excellent. The terminal                            ileum, ileocecal valve, appendiceal orifice, and                            rectum were photographed. Scope In: 8:57:03 AM Scope Out: 9:12:14 AM Scope Withdrawal Time: 0 hours 12 minutes 33 seconds  Total Procedure Duration: 0 hours 15 minutes 11 seconds  Findings:                 The perianal and digital rectal examinations were                            normal.                           The terminal ileum appeared normal.                           Multiple diverticula were found in the left colon                            and right colon.                           Internal hemorrhoids were found. The hemorrhoids  were small.                           The exam was otherwise without abnormality on                            direct and retroflexion views. Complications:            No immediate complications. Estimated Blood Loss:     Estimated blood loss: none. Impression:               - The examined portion of the ileum was normal.                           - Diverticulosis in the left colon and in the right                            colon.                           - Internal hemorrhoids.                           - The examination was otherwise normal on direct                            and retroflexion views.                           - No specimens collected.                           Apparent false positive FOBT. Recommendation:           - Patient has a contact number available for                            emergencies. The signs and symptoms of potential                            delayed  complications were discussed with the                            patient. Return to normal activities tomorrow.                            Written discharge instructions were provided to the                            patient.                           - Resume previous diet.                           - Continue present medications.                           -  Based on age and current guidelines, no future                            screening colonoscopy recommended. Clancey Welton L. Myrtie Neitheranis, MD 05/04/2020 9:18:20 AM This report has been signed electronically.

## 2020-05-04 NOTE — Progress Notes (Signed)
Vitals-CW  History reviewed. 

## 2020-05-04 NOTE — Progress Notes (Signed)
PT taken to PACU. Monitors in place. VSS. Report given to RN. 

## 2020-05-04 NOTE — Patient Instructions (Signed)
Handouts given for diverticulosis and hemorrhoids.  Based on your age and normal exam, you don't need another colonoscopy.  YOU HAD AN ENDOSCOPIC PROCEDURE TODAY AT THE [facility]:   Refer to the procedure report that was given to you for any specific questions about what was found during the examination.  If the procedure report does not answer your questions, please call your gastroenterologist to clarify.  If you requested that your care partner not be given the details of your procedure findings, then the procedure report has been included in a sealed envelope for you to review at your convenience later.  YOU SHOULD EXPECT: Some feelings of bloating in the abdomen. Passage of more gas than usual.  Walking can help get rid of the air that was put into your GI tract during the procedure and reduce the bloating. If you had a lower endoscopy (such as a colonoscopy or flexible sigmoidoscopy) you may notice spotting of blood in your stool or on the toilet paper. If you underwent a bowel prep for your procedure, you may not have a normal bowel movement for a few days.  Please Note:  You might notice some irritation and congestion in your nose or some drainage.  This is from the oxygen used during your procedure.  There is no need for concern and it should clear up in a day or so.  SYMPTOMS TO REPORT IMMEDIATELY:   Following lower endoscopy (colonoscopy or flexible sigmoidoscopy):  Excessive amounts of blood in the stool  Significant tenderness or worsening of abdominal pains  Swelling of the abdomen that is new, acute  Fever of 100F or higher  For urgent or emergent issues, a gastroenterologist can be reached at any hour by calling [phone]. Do not use MyChart messaging for urgent concerns.    DIET:  We do recommend a small meal at first, but then you may proceed to your regular diet.  Drink plenty of fluids but you should avoid alcoholic beverages for 24 hours.  ACTIVITY:   You should plan to take it easy for the rest of today and you should NOT DRIVE or use heavy machinery until tomorrow (because of the sedation medicines used during the test).    FOLLOW UP: Our staff will call the number listed on your records 48-72 hours following your procedure to check on you and address any questions or concerns that you may have regarding the information given to you following your procedure. If we do not reach you, we will leave a message.  We will attempt to reach you two times.  During this call, we will ask if you have developed any symptoms of COVID 19. If you develop any symptoms (ie: fever, flu-like symptoms, shortness of breath, cough etc.) before then, please call [phone].  If you test positive for Covid 19 in the 2 weeks post procedure, please call and report this information to [place].    If any biopsies were taken you will be contacted by phone or by letter within the next 1-3 weeks.  Please call us at [phone] if you have not heard about the biopsies in 3 weeks.    SIGNATURES/CONFIDENTIALITY: You and/or your care partner have signed paperwork which will be entered into your electronic medical record.  These signatures attest to the fact that that the information above on your After Visit Summary has been reviewed and is understood.  Full responsibility of the confidentiality of this discharge information lies with you and/or your  care-partner.

## 2020-05-06 ENCOUNTER — Telehealth: Payer: Self-pay | Admitting: *Deleted

## 2020-05-06 NOTE — Telephone Encounter (Signed)
  Follow up Call-  Call back number 05/04/2020  Post procedure Call Back phone  # [phone]  Permission to leave phone message No  Some recent data might be hidden     Patient questions:  Do you have a fever, pain , or abdominal swelling? No. Pain Score  0 *  Have you tolerated food without any problems? Yes.    Have you been able to return to your normal activities? Yes.    Do you have any questions about your discharge instructions: Diet   No. Medications  No. Follow up visit  No.  Do you have questions or concerns about your Care? No.  Actions: * If pain score is 4 or above: No action needed, pain <4 1. Have you developed a fever since your procedure? NO  2.   Have you had an respiratory symptoms (SOB or cough) since your procedure? NO  3.   Have you tested positive for COVID 19 since your procedure NO  4.   Have you had any family members/close contacts diagnosed with the COVID 19 since your procedure?  NO   If yes to any of these questions please route to Laverna Peace, RN and Karlton Lemon, RN

## 2020-05-16 ENCOUNTER — Other Ambulatory Visit: Payer: Self-pay | Admitting: Family Medicine

## 2020-05-16 MED ORDER — AMPHETAMINE-DEXTROAMPHETAMINE 10 MG PO TABS: ORAL_TABLET | ORAL | 0 refills | Status: DC

## 2020-05-16 NOTE — Telephone Encounter (Signed)
Patient requesting refill of Adderall. Please send to same [facility].

## 2020-05-16 NOTE — Telephone Encounter (Signed)
OK, eRx done for this month and for next month.

## 2020-05-16 NOTE — Telephone Encounter (Signed)
Requesting: adderall  Contract: 12/24/19 UDS:12/24/19 Last Visit:12/24/19 Next Visit:06/23/20 Last Refill:12/24/19(60,0) electronic refills for Oct and Nov  Please Advise. Medication pending

## 2020-05-17 NOTE — Telephone Encounter (Signed)
Patient made aware refills sent 

## 2020-05-19 ENCOUNTER — Telehealth: Payer: Self-pay | Admitting: *Deleted

## 2020-06-20 ENCOUNTER — Other Ambulatory Visit: Payer: Self-pay

## 2020-06-23 ENCOUNTER — Encounter: Payer: Self-pay | Admitting: Family Medicine

## 2020-06-23 ENCOUNTER — Ambulatory Visit (INDEPENDENT_AMBULATORY_CARE_PROVIDER_SITE_OTHER): Payer: Medicare Other | Admitting: Family Medicine

## 2020-06-23 VITALS — BP 146/75 | HR 67 | Temp 98.3°F | Resp 16 | Wt 166.2 lb

## 2020-06-23 DIAGNOSIS — N529 Male erectile dysfunction, unspecified: Secondary | ICD-10-CM

## 2020-06-23 DIAGNOSIS — F988 Other specified behavioral and emotional disorders with onset usually occurring in childhood and adolescence: Secondary | ICD-10-CM | POA: Diagnosis not present

## 2020-06-23 DIAGNOSIS — R030 Elevated blood-pressure reading, without diagnosis of hypertension: Secondary | ICD-10-CM

## 2020-06-23 MED ORDER — AMPHETAMINE-DEXTROAMPHETAMINE 10 MG PO TABS: ORAL_TABLET | ORAL | 0 refills | Status: DC

## 2020-06-23 MED ORDER — SILDENAFIL CITRATE 20 MG PO TABS: ORAL_TABLET | ORAL | 1 refills | Status: DC

## 2020-06-23 NOTE — Progress Notes (Signed)
OFFICE VISIT  06/23/2020  CC:  Chief Complaint  Patient presents with  . Follow-up    RCI;    HPI:    Patient is a 70 y.o. Caucasian male who presents for 6 mo f/u adult ADD and prediabetes. A/P as of last visit: "1) Adult ADD.  The current medical regimen is effective;  continue present plan and medications. I did electronic rx's for adderall 10mg , 1 bid, #60 today for Sept, Oct, Nov.  Appropriate fill on/after date was noted on each rx. CSC renewal today and UDS today.  2) Secondary male hypogonadism: feeling ok since running out of clomid 1 mo ago. He has elected to stay off clomid for now. He'll call in 1 mo if he wants to get testost, LH repeated. CBC today.  3) Prediabetes: working on TLC. Not fasting today. BMET and HbA1c today."  INTERIM HX: He's feeling well. He restarted clomid a couple weeks ago, no change. Says he restarted it b/c erection not as firm as used to be, a bit of trouble with vag penetration of partner.  Libido intact. Has never tried "typical" ED meds.  ADD: Pt states all is going well with the med at current dosing: much improved focus, concentration, task completion.  Less frustration, better multitasking, less impulsivity and restlessness.  Mood is stable. No side effects from the medication.   Positive FIT test for colon ca screening->colonoscopy showed some diverticulosis but otherwise normal-->false pos FIT.  PMP AWARE reviewed today: most recent rx for adderall 10mg  was filled 06/15/20, # 60, rx by me. No red flags.  ROS: no fevers, no CP, no SOB, no wheezing, no cough, no dizziness, no HAs, no rashes, no melena/hematochezia.  No polyuria or polydipsia.  No myalgias or arthralgias.  No focal weakness, paresthesias, or tremors.  No acute vision or hearing abnormalities. No n/v/d or abd pain.  No palpitations.     Past Medical History:  Diagnosis Date  . Adult ADHD   . Cataract   . Chronic pain of right knee 11/2018   osteoarthritis +  pes anserine bursitis (PT as of 12/2018)  . Colon cancer screening 03/2016   2017 Cologuard NEG.  2020 FIT test NEG 12/22/17. FIT POS 12/2019-->colonoscopy normal.  . Diverticulosis 04/2020   noted on colonoscopy done for + FIT screening (colonoscopy o/w normal).  . Former smoker   . Glenohumeral arthritis, right 2020   advanced as of Dr. 02/21/18 11/2018 eval.  . 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  With his positive skin test he was treated for latent TB with INH but admits he wasn't fully compliant with the med (CXR pending as of 03/24/15).  03/2015 History of rib fracture    Multiple on L side (remote past + 04/2015)  . Multiple closed fractures of metatarsal bone of right foot    2nd and 5th metatarsals  . Prediabetes    A1c 5.9% 08/2017  . Secondary male hypogonadism    clomid 04/2019--helpful    Past Surgical History:  Procedure Laterality Date  . COLONOSCOPY  05/04/2020   NORMAL  . SEPTOPLASTY  2010   deviated septum, per pt    Outpatient Medications Prior to Visit  Medication Sig Dispense Refill  . amphetamine-dextroamphetamine (ADDERALL) 10 MG tablet TAKE ONE TABLET BY MOUTH TWICE DAILY 60 tablet 0   No facility-administered medications prior to visit.    No Known Allergies  ROS As per HPI  PE: Vitals with BMI  06/23/2020 05/04/2020 05/04/2020  Height - - -  Weight 166 lbs 3 oz - -  BMI - - -  Systolic 146 135 381  Diastolic 75 66 61  Pulse 67 56 58     Wt Readings from Last 2 Encounters:  06/23/20 166 lb 3.2 oz (75.4 kg)  05/04/20 165 lb (74.8 kg)    Gen: alert, oriented x 4, affect pleasant.  Lucid thinking and conversation noted. HEENT: PERRLA, EOMI.   Neck: no LAD, mass, or thyromegaly. CV: RRR, no m/r/g LUNGS: CTA bilat, nonlabored. NEURO: no tremor or tics noted on observation.  Coordination intact. CN 2-12 grossly intact bilaterally, strength 5/5 in all extremeties.  No ataxia.   LABS:  No results found for: TSH Lab  Results  Component Value Date   WBC 6.0 12/24/2019   HGB 13.7 12/24/2019   HCT 41.0 12/24/2019   MCV 94.0 12/24/2019   PLT 207.0 12/24/2019   Lab Results  Component Value Date   CREATININE 1.20 12/24/2019   BUN 20 12/24/2019   NA 138 12/24/2019   K 4.4 12/24/2019   CL 103 12/24/2019   CO2 30 12/24/2019   Lab Results  Component Value Date   ALT 13 08/29/2017   AST 18 08/29/2017   ALKPHOS 43 08/29/2017   BILITOT 0.6 08/29/2017   No results found for: CHOL No results found for: HDL No results found for: LDLCALC No results found for: TRIG No results found for: CHOLHDL Lab Results  Component Value Date   PSA 1.29 05/04/2019   PSA 1.52 08/29/2017   Lab Results  Component Value Date   HGBA1C 6.0 12/24/2019   IMPRESSION AND PLAN:  1) Adult ADD, stable, doing well on adderall 10mg  bid long term. I did electronic rx's for this med today for each of the next 3 mo.  Appropriate fill on/after date was noted on each rx.  2) ED: pt wants to try low dose sildenafil.  I erx'd 20mg , 1 tab qd prn, #2, rf x 1. I told him I don't think he needs to cont clomiphene b/c testosterone/hormones not the etiology of his ED.  3) Elevated bp w/out dx HTN: has had some normal bp's in office and some similar to today's.  I recommended he restart home bp monitoring and call if consistently >140/90.  4) Prediabetes: cont efforts at improving diet and exercise. Plan a1c recheck 6 mo.  An After Visit Summary was printed and given to the patient.  FOLLOW UP: No follow-ups on file.  Signed:  , MD           06/23/2020

## 2020-06-30 ENCOUNTER — Telehealth: Payer: Self-pay | Admitting: Family Medicine

## 2020-06-30 NOTE — Telephone Encounter (Signed)
Left message for patient to schedule Annual Wellness Visit.  Please schedule with Nurse Health Advisor Martha Stanley, RN at [facility]  °

## 2020-07-08 NOTE — Telephone Encounter (Signed)
error 

## 2020-08-03 ENCOUNTER — Other Ambulatory Visit: Payer: Self-pay

## 2020-08-03 ENCOUNTER — Ambulatory Visit (INDEPENDENT_AMBULATORY_CARE_PROVIDER_SITE_OTHER): Payer: Medicare Other

## 2020-08-03 VITALS — BP 138/80 | HR 75 | Temp 97.5°F | Resp 16 | Ht 69.5 in | Wt 169.4 lb

## 2020-08-03 DIAGNOSIS — Z0000 Encounter for general adult medical examination without abnormal findings: Secondary | ICD-10-CM | POA: Diagnosis not present

## 2020-08-03 NOTE — Patient Instructions (Signed)
Clinton Adams , Thank you for taking time to come for your Medicare Wellness Visit. I appreciate your ongoing commitment to your health goals. Please review the following plan we discussed and let me know if I can assist you in the future.   Screening recommendations/referrals: Colonoscopy: Completed 05/04/2020-No longer required. Recommended yearly ophthalmology/optometry visit for glaucoma screening and checkup Recommended yearly dental visit for hygiene and checkup  Vaccinations: Influenza vaccine: Due-12/2019 Pneumococcal vaccine: Declined Tdap vaccine: Discuss with pharmacy Shingles vaccine: Discuss with pharmacy  Covid-19: Declined  Advanced directives: Declined information today  Conditions/risks identified: See problem list  Next appointment: Follow up in one year for your annual wellness visit.   Preventive Care 5 Years and Older, Male Preventive care refers to lifestyle choices and visits with your health care provider that can promote health and wellness. What does preventive care include?  A yearly physical exam. This is also called an annual well check.  Dental exams once or twice a year.  Routine eye exams. Ask your health care provider how often you should have your eyes checked.  Personal lifestyle choices, including:  Daily care of your teeth and gums.  Regular physical activity.  Eating a healthy diet.  Avoiding tobacco and drug use.  Limiting alcohol use.  Practicing safe sex.  Taking low doses of aspirin every day.  Taking vitamin and mineral supplements as recommended by your health care provider. What happens during an annual well check? The services and screenings done by your health care provider during your annual well check will depend on your age, overall health, lifestyle risk factors, and family history of disease. Counseling  Your health care provider may ask you questions about your:  Alcohol use.  Tobacco use.  Drug  use.  Emotional well-being.  Home and relationship well-being.  Sexual activity.  Eating habits.  History of falls.  Memory and ability to understand (cognition).  Work and work Astronomer. Screening  You may have the following tests or measurements:  Height, weight, and BMI.  Blood pressure.  Lipid and cholesterol levels. These may be checked every 5 years, or more frequently if you are over 67 years old.  Skin check.  Lung cancer screening. You may have this screening every year starting at age 44 if you have a 30-pack-year history of smoking and currently smoke or have quit within the past 15 years.  Fecal occult blood test (FOBT) of the stool. You may have this test every year starting at age 85.  Flexible sigmoidoscopy or colonoscopy. You may have a sigmoidoscopy every 5 years or a colonoscopy every 10 years starting at age 75.  Prostate cancer screening. Recommendations will vary depending on your family history and other risks.  Hepatitis C blood test.  Hepatitis B blood test.  Sexually transmitted disease (STD) testing.  Diabetes screening. This is done by checking your blood sugar (glucose) after you have not eaten for a while (fasting). You may have this done every 1-3 years.  Abdominal aortic aneurysm (AAA) screening. You may need this if you are a current or former smoker.  Osteoporosis. You may be screened starting at age 47 if you are at high risk. Talk with your health care provider about your test results, treatment options, and if necessary, the need for more tests. Vaccines  Your health care provider may recommend certain vaccines, such as:  Influenza vaccine. This is recommended every year.  Tetanus, diphtheria, and acellular pertussis (Tdap, Td) vaccine. You may need a Td  booster every 10 years.  Zoster vaccine. You may need this after age 5.  Pneumococcal 13-valent conjugate (PCV13) vaccine. One dose is recommended after age  7.  Pneumococcal polysaccharide (PPSV23) vaccine. One dose is recommended after age 30. Talk to your health care provider about which screenings and vaccines you need and how often you need them. This information is not intended to replace advice given to you by your health care provider. Make sure you discuss any questions you have with your health care provider. Document Released: 05/06/2015 Document Revised: 12/28/2015 Document Reviewed: 02/08/2015 Elsevier Interactive Patient Education  2017 [employer] Prevention in the Home Falls can cause injuries. They can happen to people of all ages. There are many things you can do to make your home safe and to help prevent falls. What can I do on the outside of my home?  Regularly fix the edges of walkways and driveways and fix any cracks.  Remove anything that might make you trip as you walk through a door, such as a raised step or threshold.  Trim any bushes or trees on the path to your home.  Use bright outdoor lighting.  Clear any walking paths of anything that might make someone trip, such as rocks or tools.  Regularly check to see if handrails are loose or broken. Make sure that both sides of any steps have handrails.  Any raised decks and porches should have guardrails on the edges.  Have any leaves, snow, or ice cleared regularly.  Use sand or salt on walking paths during winter.  Clean up any spills in your garage right away. This includes oil or grease spills. What can I do in the bathroom?  Use night lights.  Install grab bars by the toilet and in the tub and shower. Do not use towel bars as grab bars.  Use non-skid mats or decals in the tub or shower.  If you need to sit down in the shower, use a plastic, non-slip stool.  Keep the floor dry. Clean up any water that spills on the floor as soon as it happens.  Remove soap buildup in the tub or shower regularly.  Attach bath mats securely with double-sided  non-slip rug tape.  Do not have throw rugs and other things on the floor that can make you trip. What can I do in the bedroom?  Use night lights.  Make sure that you have a light by your bed that is easy to reach.  Do not use any sheets or blankets that are too big for your bed. They should not hang down onto the floor.  Have a firm chair that has side arms. You can use this for support while you get dressed.  Do not have throw rugs and other things on the floor that can make you trip. What can I do in the kitchen?  Clean up any spills right away.  Avoid walking on wet floors.  Keep items that you use a lot in easy-to-reach places.  If you need to reach something above you, use a strong step stool that has a grab bar.  Keep electrical cords out of the way.  Do not use floor polish or wax that makes floors slippery. If you must use wax, use non-skid floor wax.  Do not have throw rugs and other things on the floor that can make you trip. What can I do with my stairs?  Do not leave any items on the stairs.  Make sure that there are handrails on both sides of the stairs and use them. Fix handrails that are broken or loose. Make sure that handrails are as long as the stairways.  Check any carpeting to make sure that it is firmly attached to the stairs. Fix any carpet that is loose or worn.  Avoid having throw rugs at the top or bottom of the stairs. If you do have throw rugs, attach them to the floor with carpet tape.  Make sure that you have a light switch at the top of the stairs and the bottom of the stairs. If you do not have them, ask someone to add them for you. What else can I do to help prevent falls?  Wear shoes that:  Do not have high heels.  Have rubber bottoms.  Are comfortable and fit you well.  Are closed at the toe. Do not wear sandals.  If you use a stepladder:  Make sure that it is fully opened. Do not climb a closed stepladder.  Make sure that both  sides of the stepladder are locked into place.  Ask someone to hold it for you, if possible.  Clearly mark and make sure that you can see:  Any grab bars or handrails.  First and last steps.  Where the edge of each step is.  Use tools that help you move around (mobility aids) if they are needed. These include:  Canes.  Walkers.  Scooters.  Crutches.  Turn on the lights when you go into a dark area. Replace any light bulbs as soon as they burn out.  Set up your furniture so you have a clear path. Avoid moving your furniture around.  If any of your floors are uneven, fix them.  If there are any pets around you, be aware of where they are.  Review your medicines with your doctor. Some medicines can make you feel dizzy. This can increase your chance of falling. Ask your doctor what other things that you can do to help prevent falls. This information is not intended to replace advice given to you by your health care provider. Make sure you discuss any questions you have with your health care provider. Document Released: 02/03/2009 Document Revised: 09/15/2015 Document Reviewed: 05/14/2014 Elsevier Interactive Patient Education  2017 [employer].

## 2020-08-03 NOTE — Progress Notes (Signed)
Subjective:   Clinton Adams is a 70 y.o. male who presents for Medicare Annual/Subsequent preventive examination.  Review of Systems     Cardiac Risk Factors include: male gender;advanced age (>2755men, 75>65 women)     Objective:    Today's Vitals   08/03/20 1546  BP: 138/80  Pulse: 75  Resp: 16  Temp: (!) 97.5 F (36.4 C)  TempSrc: Temporal  SpO2: 95%  Weight: 169 lb 6.4 oz (76.8 kg)  Height: 5' 9.5" (1.765 m)   Body mass index is 24.66 kg/m.  Advanced Directives 08/03/2020 09/09/2017 03/30/2016  Does Patient Have a Medical Advance Directive? No No No  Would patient like information on creating a medical advance directive? No - Patient declined No - Patient declined No - Patient declined    Current Medications (verified) Outpatient Encounter Medications as of 08/03/2020  Medication Sig  . amphetamine-dextroamphetamine (ADDERALL) 10 MG tablet TAKE ONE TABLET BY MOUTH TWICE DAILY  . clomiPRAMINE (ANAFRANIL) 25 MG capsule Take 25 mg by mouth at bedtime.  . sildenafil (REVATIO) 20 MG tablet 1 tab po qd prn intercourse   No facility-administered encounter medications on file as of 08/03/2020.    Allergies (verified) Patient has no known allergies.   History: Past Medical History:  Diagnosis Date  . Adult ADHD   . Cataract   . Chronic pain of right knee 11/2018   osteoarthritis + pes anserine bursitis (PT as of 12/2018)  . Colon cancer screening 03/2016   2017 Cologuard NEG.  2020 FIT test NEG 12/22/17. FIT POS 12/2019-->colonoscopy normal.  . Diverticulosis 04/2020   noted on colonoscopy done for + FIT screening (colonoscopy o/w normal).  . Former smoker   . Glenohumeral arthritis, right 2020   advanced as of Dr. Dub MikesSupple's 11/2018 eval.  . 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  With his positive skin test he was treated for latent TB with INH but admits he wasn't fully compliant with the med (CXR pending as of 03/24/15).  Marland Kitchen. History of  rib fracture    Multiple on L side (remote past + 04/2015)  . Multiple closed fractures of metatarsal bone of right foot    2nd and 5th metatarsals  . Prediabetes    A1c 5.9% 08/2017  . Secondary male hypogonadism    clomid 04/2019--helpful   Past Surgical History:  Procedure Laterality Date  . COLONOSCOPY  05/04/2020   NORMAL  . SEPTOPLASTY  2010   deviated septum, per pt   Family History  Problem Relation Age of Onset  . Hypertension Mother   . Lung cancer Father   . Heart disease Father   . Colon cancer Neg Hx   . Esophageal cancer Neg Hx   . Rectal cancer Neg Hx   . Stomach cancer Neg Hx    Social History   Socioeconomic History  . Marital status: Married    Spouse name: Not on file  . Number of children: Not on file  . Years of education: Not on file  . Highest education level: Not on file  Occupational History  . Not on file  Tobacco Use  . Smoking status: Former Smoker    Packs/day: 1.00    Years: 30.00    Pack years: 30.00    Types: Cigarettes    Quit date: 04/23/1994    Years since quitting: 26.2  . Smokeless tobacco: Never Used  Vaping Use  . Vaping Use: Never used  Substance and  Sexual Activity  . Alcohol use: Yes    Comment: daily  . Drug use: No  . Sexual activity: Not on file  Other Topics Concern  . Not on file  Social History Narrative   Married, one daughter.  No grandchilden.   Occup: Retired PA, CV surg/trauma surg/ortho, some FP.   Owns [employer] in [place], [place].   Former smoker; 30 pack-yr hx, quit age 74.  Alc:  1-2 hard liquor drinks a day.   Exercise: bicycle   Enjoys motorcycles, used to ride [place], has a triumph.   Social Determinants of Health   Financial Resource Strain: Low Risk   . Difficulty of Paying Living Expenses: Not hard at all  Food Insecurity: No Food Insecurity  . Worried About Programme researcher, broadcasting/film/video in the Last Year: Never true  . Ran Out of Food in the Last Year: Never true  Transportation Needs: No  Transportation Needs  . Lack of Transportation (Medical): No  . Lack of Transportation (Non-Medical): No  Physical Activity: Inactive  . Days of Exercise per Week: 0 days  . Minutes of Exercise per Session: 0 min  Stress: No Stress Concern Present  . Feeling of Stress : Not at all  Social Connections: Moderately Isolated  . Frequency of Communication with Friends and Family: More than three times a week  . Frequency of Social Gatherings with Friends and Family: More than three times a week  . Attends Religious Services: Never  . Active Member of Clubs or Organizations: No  . Attends Banker Meetings: Never  . Marital Status: Married    Tobacco Counseling Counseling given: Not Answered   Clinical Intake:  Pre-visit preparation completed: Yes  Pain : No/denies pain     Nutritional Status: BMI of 19-24  Normal Nutritional Risks: None Diabetes: No  How often do you need to have someone help you when you read instructions, pamphlets, or other written materials from your doctor or pharmacy?: 1 - Never  Diabetic?No  Interpreter Needed?: No  Information entered by :: Thomasenia Sales LPN   Activities of Daily Living In your present state of health, do you have any difficulty performing the following activities: 08/03/2020 06/23/2020  Hearing? N N  Vision? N N  Difficulty concentrating or making decisions? N N  Walking or climbing stairs? N N  Dressing or bathing? N N  Doing errands, shopping? N N  Preparing Food and eating ? N -  Using the Toilet? N -  In the past six months, have you accidently leaked urine? N -  Do you have problems with loss of bowel control? N -  Managing your Medications? N -  Managing your Finances? N -  Housekeeping or managing your Housekeeping? N -  Some recent data might be hidden    Patient Care Team: Jeoffrey Massed, MD as PCP - General (Family Medicine) Regal, Kirstie Peri, DPM as Consulting Physician (Podiatry) Malvin Johns as Physician Assistant (Physician Assistant) Francena Hanly, MD as Consulting Physician (Orthopedic Surgery) Ollen Gross, MD as Consulting Physician (Orthopedic Surgery) Myrtie Neither Andreas Blower, MD as Consulting Physician (Gastroenterology)  Indicate any recent Medical Services you may have received from other than [facility] providers in the past year (date may be approximate).     Assessment:   This is a routine wellness examination for [place].  Hearing/Vision screen  Hearing Screening   125Hz  250Hz  500Hz  1000Hz  2000Hz  3000Hz  4000Hz  6000Hz  8000Hz   Right ear:  Left ear:           Comments: No issues  Vision Screening Comments: Wears glasses Last eye exam-07/2020-Dr. Kunis  Dietary issues and exercise activities discussed: Current Exercise Habits: The patient has a physically strenuous job, but has no regular exercise apart from work., Exercise limited by: None identified  Goals    . Patient Stated     Maintain current health.       Depression Screen PHQ 2/9 Scores 08/03/2020 06/23/2020 05/15/2018 09/09/2017 03/30/2016 08/04/2015  PHQ - 2 Score 0 0 0 4 6 0  PHQ- 9 Score - - 0 8 7 -  Exception Documentation - - Medical reason - - -    Fall Risk Fall Risk  08/03/2020 06/23/2020 05/15/2018 09/09/2017 03/30/2016  Falls in the past year? 0 0 0 No Yes  Comment - - - - Slipped down stairs, broke rib  Number falls in past yr: 0 0 0 - 1  Comment - - - - -  Injury with Fall? 0 0 0 - Yes  Comment - - - - -  Risk for fall due to : - - - - History of fall(s)  Follow up Falls prevention discussed - - - Falls prevention discussed    FALL RISK PREVENTION PERTAINING TO THE HOME:  Any stairs in or around the home? Yes  If so, are there any without handrails? No  Home free of loose throw rugs in walkways, pet beds, electrical cords, etc? Yes  Adequate lighting in your home to reduce risk of falls? Yes   ASSISTIVE DEVICES UTILIZED TO PREVENT FALLS:  Life alert? No  Use of a  cane, walker or w/c? No  Grab bars in the bathroom? No  Shower chair or bench in shower? No  Elevated toilet seat or a handicapped toilet? No   TIMED UP AND GO:  Was the test performed? Yes .  Length of time to ambulate 10 feet: 9 sec.   Gait steady and fast without use of assistive device  Cognitive Function:Normal cognitive status assessed by direct observation by this Nurse Health Advisor. No abnormalities found.          Immunizations Immunization History  Administered Date(s) Administered  . Influenza, High Dose Seasonal PF 03/25/2017, 01/27/2018, 01/21/2019  . Influenza-Unspecified 03/08/2016    TDAP status: Due, Education has been provided regarding the importance of this vaccine. Advised may receive this vaccine at local pharmacy or Health Dept. Aware to provide a copy of the vaccination record if obtained from local pharmacy or Health Dept. Verbalized acceptance and understanding.  Flu Vaccine status: Due, Education has been provided regarding the importance of this vaccine. Advised may receive this vaccine at local pharmacy or Health Dept. Aware to provide a copy of the vaccination record if obtained from local pharmacy or Health Dept. Verbalized acceptance and understanding.  Pneumococcal vaccine status: Declined,  Education has been provided regarding the importance of this vaccine but patient still declined. Advised may receive this vaccine at local pharmacy or Health Dept. Aware to provide a copy of the vaccination record if obtained from local pharmacy or Health Dept. Verbalized acceptance and understanding.   Covid-19 vaccine status: Declined, Education has been provided regarding the importance of this vaccine but patient still declined. Advised may receive this vaccine at local pharmacy or Health Dept.or vaccine clinic. Aware to provide a copy of the vaccination record if obtained from local pharmacy or Health Dept. Verbalized acceptance and  understanding.  Qualifies for  Shingles Vaccine? Yes   Zostavax completed No   Shingrix Completed?: No.    Education has been provided regarding the importance of this vaccine. Patient has been advised to call insurance company to determine out of pocket expense if they have not yet received this vaccine. Advised may also receive vaccine at local pharmacy or Health Dept. Verbalized acceptance and understanding.  Screening Tests Health Maintenance  Topic Date Due  . Hepatitis C Screening  Never done  . COVID-19 Vaccine (1) Never done  . TETANUS/TDAP  Never done  . PNA vac Low Risk Adult (1 of 2 - PCV13) Never done  . INFLUENZA VACCINE  11/21/2020  . COLON CANCER SCREENING ANNUAL FOBT  01/04/2021  . COLONOSCOPY (Pts 45-78yrs Insurance coverage will need to be confirmed)  05/05/2023  . HPV VACCINES  Aged Out    Health Maintenance  Health Maintenance Due  Topic Date Due  . Hepatitis C Screening  Never done  . COVID-19 Vaccine (1) Never done  . TETANUS/TDAP  Never done  . PNA vac Low Risk Adult (1 of 2 - PCV13) Never done    Colorectal cancer screening: No longer required. Per report from 05/04/2020  Lung Cancer Screening: (Low Dose CT Chest recommended if Age 22-80 years, 30 pack-year currently smoking OR have quit w/in 15years.) does not qualify.     Additional Screening:  Hepatitis C Screening: does qualify; Declined  Vision Screening: Recommended annual ophthalmology exams for early detection of glaucoma and other disorders of the eye. Is the patient up to date with their annual eye exam?  Yes  Who is the provider or what is the name of the office in which the patient attends annual eye exams? Dr. Devin Going    Dental Screening: Recommended annual dental exams for proper oral hygiene  Community Resource Referral / Chronic Care Management: CRR required this visit?  No   CCM required this visit?  No      Plan:     I have personally reviewed and noted the following in the  patient's chart:   . Medical and social history . Use of alcohol, tobacco or illicit drugs  . Current medications and supplements . Functional ability and status . Nutritional status . Physical activity . Advanced directives . List of other physicians . Hospitalizations, surgeries, and ER visits in previous 12 months . Vitals . Screenings to include cognitive, depression, and falls . Referrals and appointments  In addition, I have reviewed and discussed with patient certain preventive protocols, quality metrics, and best practice recommendations. A written personalized care plan for preventive services as well as general preventive health recommendations were provided to patient.   Patient declined avs.  Roanna Raider, LPN   3/79/0240  Nurse Health Advisor  Nurse Notes: None

## 2020-10-13 ENCOUNTER — Encounter: Payer: Self-pay | Admitting: Family Medicine

## 2020-10-13 ENCOUNTER — Other Ambulatory Visit: Payer: Self-pay

## 2020-10-13 ENCOUNTER — Ambulatory Visit (INDEPENDENT_AMBULATORY_CARE_PROVIDER_SITE_OTHER): Payer: Medicare Other | Admitting: Family Medicine

## 2020-10-13 VITALS — BP 136/81 | HR 61 | Temp 97.4°F | Resp 16 | Ht 69.5 in | Wt 167.6 lb

## 2020-10-13 DIAGNOSIS — F988 Other specified behavioral and emotional disorders with onset usually occurring in childhood and adolescence: Secondary | ICD-10-CM

## 2020-10-13 DIAGNOSIS — Z125 Encounter for screening for malignant neoplasm of prostate: Secondary | ICD-10-CM | POA: Diagnosis not present

## 2020-10-13 DIAGNOSIS — E291 Testicular hypofunction: Secondary | ICD-10-CM | POA: Diagnosis not present

## 2020-10-13 DIAGNOSIS — Z8615 Personal history of latent tuberculosis infection: Secondary | ICD-10-CM | POA: Diagnosis not present

## 2020-10-13 LAB — LUTEINIZING HORMONE: LH: 7.67 m[IU]/mL (ref 3.10–34.60)

## 2020-10-13 NOTE — Progress Notes (Signed)
OFFICE VISIT  10/13/2020  CC:  Chief Complaint  Patient presents with   Follow-up    Adult ADD; would like testosterone checked   HPI:    Patient is a 70 y.o. Caucasian male who presents for 3 mo f/u adult ADD and pt desires PSA for prostate ca screening. A/P as of last visit: "1) Adult ADD, stable, doing well on adderall 10mg  bid long term. I did electronic rx's for this med today for each of the next 3 mo.  Appropriate fill on/after date was noted on each rx.   2) ED: pt wants to try low dose sildenafil.  I erx'd 20mg , 1 tab qd prn, #2, rf x 1. I told him I don't think he needs to cont clomiphene b/c testosterone/hormones not the etiology of his ED.   3) Elevated bp w/out dx HTN: has had some normal bp's in office and some similar to today's.  I recommended he restart home bp monitoring and call if consistently >140/90.   4) Prediabetes: cont efforts at improving diet and exercise. Plan a1c recheck 6 mo."  INTERIM HX: Doing well. Has not tried the viagra yet.  Did not feel like clomid made any diff in his libido or ED. Has mildly decreased libido but his main issue is getting an erection firm enough for penetration sometimes.  Pt states all is going well with the med at current dosing: much improved focus, concentration, task completion.  Less frustration, better multitasking, less impulsivity and restlessness.  Mood is stable. No side effects from the medication.  Smoked 30 pack-yrs, quit age 68. He has no pulm sx's, he runs for exercise.  PMP AWARE reviewed today: most recent rx for adderall 10mg  was filled 09/30/20, # 60, rx by me. No red flags.  Past Medical History:  Diagnosis Date   Adult ADHD    Cataract    Chronic pain of right knee 11/2018   osteoarthritis + pes anserine bursitis (PT as of 12/2018)   Colon cancer screening 03/2016   2017 Cologuard NEG.  2020 FIT test NEG 12/22/17. FIT POS 12/2019-->colonoscopy normal.   Diverticulosis 04/2020   noted on  colonoscopy done for + FIT screening (colonoscopy o/w normal).   Former smoker    Glenohumeral arthritis, right 2020   advanced as of Dr. 02/21/18 11/2018 eval.   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  With his positive skin test he was treated for latent TB with INH but admits he wasn't fully compliant with the med (CXR pending as of 03/24/15).   History of rib fracture    Multiple on L side (remote past + 04/2015)   Multiple closed fractures of metatarsal bone of right foot    2nd and 5th metatarsals   Prediabetes    A1c 5.9% 08/2017   Secondary male hypogonadism    clomid 04/2019--helpful    Past Surgical History:  Procedure Laterality Date   COLONOSCOPY  05/04/2020   NORMAL   SEPTOPLASTY  2010   deviated septum, per pt    Outpatient Medications Prior to Visit  Medication Sig Dispense Refill   amphetamine-dextroamphetamine (ADDERALL) 10 MG tablet TAKE ONE TABLET BY MOUTH TWICE DAILY 60 tablet 0   clomiPRAMINE (ANAFRANIL) 25 MG capsule Take 25 mg by mouth at bedtime.     sildenafil (REVATIO) 20 MG tablet 1 tab po qd prn intercourse 2 tablet 1   No facility-administered medications prior to visit.    No Known Allergies  ROS As per HPI  PE: Vitals with BMI 10/13/2020 08/03/2020 06/23/2020  Height 5' 9.5" 5' 9.5" -  Weight 167 lbs 10 oz 169 lbs 6 oz 166 lbs 3 oz  BMI 24.4 24.67 -  Systolic 136 138 564  Diastolic 81 80 75  Pulse 61 75 67    Gen: Alert, well appearing.  Patient is oriented to person, place, time, and situation. AFFECT: pleasant, lucid thought and speech. NO further exam today.  LABS:  No results found for: TSH  Lab Results  Component Value Date   TESTOSTERONE 522 06/18/2019    Lab Results  Component Value Date   WBC 6.0 12/24/2019   HGB 13.7 12/24/2019   HCT 41.0 12/24/2019   MCV 94.0 12/24/2019   PLT 207.0 12/24/2019   Lab Results  Component Value Date   CREATININE 1.20 12/24/2019   BUN 20 12/24/2019   NA  138 12/24/2019   K 4.4 12/24/2019   CL 103 12/24/2019   CO2 30 12/24/2019   Lab Results  Component Value Date   ALT 13 08/29/2017   AST 18 08/29/2017   ALKPHOS 43 08/29/2017   BILITOT 0.6 08/29/2017   No results found for: CHOL No results found for: HDL No results found for: LDLCALC No results found for: TRIG No results found for: [facility] Lab Results  Component Value Date   PSA 1.29 05/04/2019   PSA 1.52 08/29/2017   Lab Results  Component Value Date   HGBA1C 6.0 12/24/2019   IMPRESSION AND PLAN:  1) Dec libido, hx of secondary hypogonadism. No signif improvement on clomid---he stopped this me about 1 mo ago. He desires testost check again today-->I ordered testosterone and LH today. He has mild ED that I think is likely wnl for age but he does have some viagra to try prn.  2) Adult ADD.  Stable. CSC UTD, needs renewed at next f/u. Cont adderall 10mg  bid. No new rx needed today.  3) Prostate ca screening: discussed this today and due to limitations of the utility of PSA testing after age 34 he decided to not screen anymore.  4) Hx of latent TB.  CXR ordered today.  An After Visit Summary was printed and given to the patient.  FOLLOW UP: Return in about 6 months (around 04/14/2021) for routine chronic illness f/u. Cpe/RCI 6 mo  Signed:  04/16/2021, MD           10/13/2020

## 2020-10-14 LAB — TESTOSTERONE TOTAL,FREE,BIO, MALES
Albumin: 4.2 g/dL (ref 3.6–5.1)
Sex Hormone Binding: 51 nmol/L (ref 22–77)
Testosterone, Bioavailable: 100.3 ng/dL (ref 15.0–150.0)
Testosterone, Free: 52.1 pg/mL (ref 6.0–73.0)
Testosterone: 550 ng/dL (ref 250–827)

## 2020-11-04 ENCOUNTER — Other Ambulatory Visit: Payer: Self-pay | Admitting: Family Medicine

## 2020-11-04 NOTE — Telephone Encounter (Signed)
Requesting: adderall Contract: 12/24/19 UDS: 12/24/19 Last Visit:10/13/20 Next Visit: advised to f/u Dec Last Refill: 06/23/20(60,0) electronic RF for april and may    Please Advise, Clomiphene 50mg  dose d/c on 12/24/19. Medications pending

## 2020-11-29 ENCOUNTER — Ambulatory Visit (HOSPITAL_BASED_OUTPATIENT_CLINIC_OR_DEPARTMENT_OTHER)
Admission: RE | Admit: 2020-11-29 | Discharge: 2020-11-29 | Disposition: A | Discharge status: Home / Self Care | Payer: Medicare Other | Source: Ambulatory Visit | Attending: Family Medicine | Admitting: Family Medicine

## 2020-11-29 ENCOUNTER — Other Ambulatory Visit: Payer: Self-pay

## 2020-11-29 DIAGNOSIS — M19012 Primary osteoarthritis, left shoulder: Secondary | ICD-10-CM | POA: Diagnosis not present

## 2020-11-29 DIAGNOSIS — Z8615 Personal history of latent tuberculosis infection: Secondary | ICD-10-CM | POA: Insufficient documentation

## 2020-11-29 DIAGNOSIS — M954 Acquired deformity of chest and rib: Secondary | ICD-10-CM | POA: Diagnosis not present

## 2020-12-12 ENCOUNTER — Other Ambulatory Visit: Payer: Self-pay | Admitting: Family Medicine

## 2020-12-12 NOTE — Telephone Encounter (Signed)
Requesting: adderall 10mg  Contract: 12/24/19 UDS: 12/24/19 Last Visit:10/13/20 Next Visit: advised to f/u 6 mo. Last Refill:11/04/20(60,0)  Please review and advise, med pending

## 2021-01-13 ENCOUNTER — Other Ambulatory Visit: Payer: Self-pay | Admitting: Family Medicine

## 2021-01-13 NOTE — Telephone Encounter (Signed)
Requesting: adderall Contract: 12/24/19 UDS:12/24/19 Last Visit:10/13/20 Next Visit: advised to f/u 6 mo Last Refill: 12/12/20(60,0)  Please Advise. Med pending

## 2021-01-16 MED ORDER — AMPHETAMINE-DEXTROAMPHETAMINE 10 MG PO TABS: 10.0000 mg | ORAL_TABLET | Freq: Two times a day (BID) | ORAL | 0 refills | Status: DC

## 2021-01-16 NOTE — Telephone Encounter (Signed)
I eRx'd adderall for each of the next 3 mo

## 2021-05-23 LAB — HCV ANTIBODY: Hepatitis C Ab: NONREACTIVE

## 2021-06-06 ENCOUNTER — Other Ambulatory Visit: Payer: Self-pay

## 2021-06-06 NOTE — Telephone Encounter (Signed)
Requesting: adderall Contract: 12/24/19 UDS: 12/24/19 Last Visit:10/13/20 Next Visit: advised to f/u 6 months Last Refill: 01/16/21(60,0)  Please Advise. Med pending

## 2021-06-06 NOTE — Telephone Encounter (Signed)
Patient walked in office to request refill on medication.  Patient aware Dr. Anitra Lauth has already left for the day and will return to office tomorrow.  Brady   amphetamine-dextroamphetamine (ADDERALL) 10 MG tablet [id number]

## 2021-06-07 MED ORDER — AMPHETAMINE-DEXTROAMPHETAMINE 10 MG PO TABS: 10.0000 mg | ORAL_TABLET | Freq: Two times a day (BID) | ORAL | 0 refills | Status: DC

## 2021-06-07 NOTE — Telephone Encounter (Signed)
Prescription done. Patient due for follow-up.

## 2021-06-07 NOTE — Telephone Encounter (Signed)
LM for pt regarding medication ?

## 2021-06-26 ENCOUNTER — Other Ambulatory Visit: Payer: Self-pay

## 2021-06-26 ENCOUNTER — Encounter: Payer: Self-pay | Admitting: Family Medicine

## 2021-06-26 ENCOUNTER — Ambulatory Visit (INDEPENDENT_AMBULATORY_CARE_PROVIDER_SITE_OTHER): Payer: Medicare Other | Admitting: Family Medicine

## 2021-06-26 VITALS — BP 128/77 | HR 63 | Temp 97.8°F | Ht 69.5 in | Wt 168.6 lb

## 2021-06-26 DIAGNOSIS — Q5561 Curvature of penis (lateral): Secondary | ICD-10-CM | POA: Diagnosis not present

## 2021-06-26 DIAGNOSIS — F988 Other specified behavioral and emotional disorders with onset usually occurring in childhood and adolescence: Secondary | ICD-10-CM | POA: Diagnosis not present

## 2021-06-26 DIAGNOSIS — M7121 Synovial cyst of popliteal space [Baker], right knee: Secondary | ICD-10-CM

## 2021-06-26 MED ORDER — AMPHETAMINE-DEXTROAMPHETAMINE 10 MG PO TABS: 10.0000 mg | ORAL_TABLET | Freq: Two times a day (BID) | ORAL | 0 refills | Status: DC

## 2021-06-26 MED ORDER — SILDENAFIL CITRATE 20 MG PO TABS: ORAL_TABLET | ORAL | 2 refills | Status: DC

## 2021-06-26 NOTE — Progress Notes (Signed)
OFFICE VISIT ? ?06/26/2021 ? ?CC:  ?Chief Complaint  ?Patient presents with  ? ADHD  ? ? ?HPI:   ? ?Patient is a 71 y.o. male who presents for f/u adult ADD. ?A/P as of last visit: ?"1) Dec libido, hx of secondary hypogonadism. ?No signif improvement on clomid---he stopped this me about 1 mo ago. ?He desires testost check again today-->I ordered testosterone and LH today. ?He has mild ED that I think is likely wnl for age but he does have some viagra to try prn. ?  ?2) Adult ADD.  Stable. ?CSC UTD, needs renewed at next f/u. ?Cont adderall 10mg  bid. ?No new rx needed today. ?  ?3) Prostate ca screening: discussed this today and due to limitations of the utility of PSA testing after age 25 he decided to not screen anymore. ?  ?4) Hx of latent TB.  CXR ordered today. ? ?INTERIM HX: ? ?ADD: Pt states all is going well with the med at current dosing, Adderall 10 mg twice daily: much improved focus, concentration, task completion.  Less frustration, better multitasking, less impulsivity and restlessness.  Mood is stable.  He takes the medication daily. ?No side effects from the medication. ? ?Noted curvature of penis to the side about 15 to 20 degrees, onset of about 6 to 8 months ago.  No pain. he does think it is affecting his ability to maintain an erection.  He requests urology referral. ? ?Noticed swelling along the medial aspect of the popliteal region on the left leg about 4 to 5 months ago after doing lots more physical labor.  The remainder of the knee did not swell. ?No redness.  No swelling of the lower leg. ? ?PMP AWARE reviewed today: most recent rx for adderall was filled 06/13/21, # 60, rx by me. ?No red flags. ? ?Past Medical History:  ?Diagnosis Date  ? Adult ADHD   ? Cataract   ? Chronic pain of right knee 11/2018  ? osteoarthritis + pes anserine bursitis (PT as of 12/2018)  ? Colon cancer screening 03/2016  ? 2017 Cologuard NEG.  2020 FIT test NEG 12/22/17. FIT POS 12/2019-->colonoscopy normal.  ?  Diverticulosis 04/2020  ? noted on colonoscopy done for + FIT screening (colonoscopy o/w normal).  ? Former smoker   ? Glenohumeral arthritis, right 2020  ? advanced as of Dr. 05/2020 11/2018 eval.  ? 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 Quantiferon gold assay POSITIVE 02/2015.  With his positive skin test he was treated for latent TB with INH but admits he wasn't fully compliant with the med (CXR pending as of 03/24/15).  ? History of rib fracture   ? Multiple on L side (remote past + 04/2015)  ? Multiple closed fractures of metatarsal bone of right foot   ? 2nd and 5th metatarsals  ? Prediabetes   ? A1c 5.9% 08/2017  ? Secondary male hypogonadism   ? clomid 04/2019--helpful  ? ? ?Past Surgical History:  ?Procedure Laterality Date  ? COLONOSCOPY  05/04/2020  ? NORMAL  ? SEPTOPLASTY  2010  ? deviated septum, per pt  ? ? ?Outpatient Medications Prior to Visit  ?Medication Sig Dispense Refill  ? sildenafil (REVATIO) 20 MG tablet 1 tab po qd prn intercourse 2 tablet 1  ? amphetamine-dextroamphetamine (ADDERALL) 10 MG tablet Take 1 tablet (10 mg total) by mouth 2 (two) times daily. 60 tablet 0  ? clomiPRAMINE (ANAFRANIL) 25 MG capsule Take 25 mg by mouth at bedtime. (Patient not taking:  Reported on 06/26/2021)    ? ?No facility-administered medications prior to visit.  ? ? ?No Known Allergies ? ?ROS ?As per HPI ? ?PE: ?Vitals with BMI 06/26/2021 10/13/2020 08/03/2020  ?Height 5' 9.5" 5' 9.5" 5' 9.5"  ?Weight 168 lbs 10 oz 167 lbs 10 oz 169 lbs 6 oz  ?BMI 24.55 24.4 24.67  ?Systolic 128 136 127  ?Diastolic 77 81 80  ?Pulse 63 61 75  ? ? ? ?Physical Exam ?Wt Readings from Last 2 Encounters:  ?06/26/21 168 lb 9.6 oz (76.5 kg)  ?10/13/20 167 lb 9.6 oz (76 kg)  ? ? ?Gen: alert, oriented x 4, affect pleasant.  Lucid thinking and conversation noted. ?HEENT: PERRLA, EOMI.   ?Neck: no LAD, mass, or thyromegaly. ?CV: RRR, no m/r/g ?LUNGS: CTA bilat, nonlabored. ?NEURO: no tremor or tics noted on observation.  Coordination intact. ?CN 2-12  grossly intact bilaterally, strength 5/5 in all extremeties.  No ataxia. ?RIGHT leg popliteal region: Medial aspect with large focal swelling, nontender, nonpulsatile.  Range of motion and knee intact, without pain or instability lower leg without any edema. ? ?LABS:  ?Last CBC ?Lab Results  ?Component Value Date  ? WBC 6.0 12/24/2019  ? HGB 13.7 12/24/2019  ? HCT 41.0 12/24/2019  ? MCV 94.0 12/24/2019  ? RDW 12.7 12/24/2019  ? PLT 207.0 12/24/2019  ? ?Last metabolic panel ?Lab Results  ?Component Value Date  ? GLUCOSE 72 12/24/2019  ? NA 138 12/24/2019  ? K 4.4 12/24/2019  ? CL 103 12/24/2019  ? CO2 30 12/24/2019  ? BUN 20 12/24/2019  ? CREATININE 1.20 12/24/2019  ? CALCIUM 9.1 12/24/2019  ? PROT 7.4 08/29/2017  ? ALBUMIN 3.9 08/29/2017  ? BILITOT 0.6 08/29/2017  ? ALKPHOS 43 08/29/2017  ? AST 18 08/29/2017  ? ALT 13 08/29/2017  ? ?Last lipids ?No results found for: CHOL, HDL, LDLCALC, LDLDIRECT, TRIG, CHOLHDL ?Last hemoglobin A1c ?Lab Results  ?Component Value Date  ? HGBA1C 6.0 12/24/2019  ? ?Last thyroid functions ?No results found for: TSH, T3TOTAL, T4TOTAL, THYROIDAB ?  ?Lab Results  ?Component Value Date  ? PSA 1.29 05/04/2019  ? PSA 1.52 08/29/2017  ? ?Lab Results  ?Component Value Date  ? TESTOSTERONE 550 10/13/2020  ? ?IMPRESSION AND PLAN: ? ?#1 ADD, stable. ?Continue Adderall 10 mg twice daily.  New prescription for April 2023 sent in today for pharmacy to hold. ? ?#2 penile curvature.  He has some decreased ability to obtain and maintain an erection and request refill of sildenafil.  He also requests referral to urologist--ordered today. ? ?3.  Right leg popliteal cyst. ?Bedside ultrasound today showed large anechoic lesion measuring 15 cm craniocaudal, about 2 cm width and 2 cm deep.  Patient will return at his convenience for aspiration. ? ?An After Visit Summary was printed and given to the patient. ? ?FOLLOW UP: No follow-ups on file. ? ?Signed:  Santiago Bumpers, MD           06/26/2021 ? ?

## 2021-08-09 ENCOUNTER — Ambulatory Visit: Payer: Medicare Other | Admitting: Family Medicine

## 2021-08-09 ENCOUNTER — Ambulatory Visit (INDEPENDENT_AMBULATORY_CARE_PROVIDER_SITE_OTHER): Payer: Medicare Other

## 2021-08-09 ENCOUNTER — Ambulatory Visit: Payer: Medicare Other

## 2021-08-09 DIAGNOSIS — Z0000 Encounter for general adult medical examination without abnormal findings: Secondary | ICD-10-CM | POA: Diagnosis not present

## 2021-08-09 NOTE — Progress Notes (Addendum)
Virtual Visit via Telephone Note ? ?I connected with  Clinton Adams on 08/09/21 at  9:00 AM EDT by telephone and verified that I am speaking with the correct person using two identifiers. ? ?Medicare Annual Wellness visit completed telephonically due to Covid-19 pandemic.  ? ?Persons participating in this call: This Health Coach and this patient.  ? ?Location: ?Patient: home ?Provider: office ?  ?I discussed the limitations, risks, security and privacy concerns of performing an evaluation and management service by telephone and the availability of in person appointments. The patient expressed understanding and agreed to proceed. ? ?Unable to perform video visit due to video visit attempted and failed and/or patient does not have video capability.  ? ?Some vital signs may be absent or patient reported.  ? ?Marzella Schlein, LPN ? ? ?Subjective:  ? Clinton Adams is a 71 y.o. male who presents for Medicare Annual/Subsequent preventive examination. ? ?Review of Systems    ? ?Cardiac Risk Factors include: advanced age (>31men, >47 women);male gender ? ?   ?Objective:  ?  ?There were no vitals filed for this visit. ?There is no height or weight on file to calculate BMI. ? ? ?  08/09/2021  ?  8:55 AM 08/03/2020  ?  3:53 PM 09/09/2017  ?  3:24 PM 03/30/2016  ?  1:26 PM  ?Advanced Directives  ?Does Patient Have a Medical Advance Directive? Yes No No No  ?Type of Estate agent of Attorney     ?Copy of Healthcare Power of Attorney in Chart? No - copy requested     ?Would patient like information on creating a medical advance directive?  No - Patient declined No - Patient declined No - Patient declined  ? ? ?Current Medications (verified) ?Outpatient Encounter Medications as of 08/09/2021  ?Medication Sig  ? amphetamine-dextroamphetamine (ADDERALL) 10 MG tablet Take 1 tablet (10 mg total) by mouth 2 (two) times daily.  ? sildenafil (REVATIO) 20 MG tablet 1-2 qd prn intercourse  ? ?No facility-administered  encounter medications on file as of 08/09/2021.  ? ? ?Allergies (verified) ?Patient has no known allergies.  ? ?History: ?Past Medical History:  ?Diagnosis Date  ? Adult ADHD   ? Cataract   ? Chronic pain of right knee 11/2018  ? osteoarthritis + pes anserine bursitis (PT as of 12/2018)  ? Colon cancer screening 03/2016  ? 2017 Cologuard NEG.  2020 FIT test NEG 12/22/17. FIT POS 12/2019-->colonoscopy normal.  ? Diverticulosis 04/2020  ? noted on colonoscopy done for + FIT screening (colonoscopy o/w normal).  ? Former smoker   ? Glenohumeral arthritis, right 2020  ? advanced as of Dr. Dub Mikes 11/2018 eval.  ? 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 Quantiferon gold assay POSITIVE 02/2015.  With his positive skin test he was treated for latent TB with INH but admits he wasn't fully compliant with the med (CXR pending as of 03/24/15).  ? History of rib fracture   ? Multiple on L side (remote past + 04/2015)  ? Multiple closed fractures of metatarsal bone of right foot   ? 2nd and 5th metatarsals  ? Prediabetes   ? A1c 5.9% 08/2017  ? Secondary male hypogonadism   ? clomid 04/2019--helpful  ? ?Past Surgical History:  ?Procedure Laterality Date  ? COLONOSCOPY  05/04/2020  ? NORMAL  ? SEPTOPLASTY  2010  ? deviated septum, per pt  ? ?Family History  ?Problem Relation Age of Onset  ? Hypertension Mother   ? Lung  cancer Father   ? Heart disease Father   ? Colon cancer Neg Hx   ? Esophageal cancer Neg Hx   ? Rectal cancer Neg Hx   ? Stomach cancer Neg Hx   ? ?Social History  ? ?Socioeconomic History  ? Marital status: Married  ?  Spouse name: Not on file  ? Number of children: Not on file  ? Years of education: Not on file  ? Highest education level: Not on file  ?Occupational History  ? Not on file  ?Tobacco Use  ? Smoking status: Former  ?  Packs/day: 1.00  ?  Years: 30.00  ?  Pack years: 30.00  ?  Types: Cigarettes  ?  Quit date: 04/23/1994  ?  Years since quitting: 27.3  ? Smokeless tobacco: Never  ?Vaping Use  ? Vaping Use: Never used   ?Substance and Sexual Activity  ? Alcohol use: Yes  ?  Comment: daily  ? Drug use: No  ? Sexual activity: Not on file  ?Other Topics Concern  ? Not on file  ?Social History Narrative  ? Married, one daughter.  No grandchilden.  ? Occup: Retired PA, CV surg/trauma surg/ortho, some FP.  ? Owns [employer] in [place], [place].  ? Former smoker; 30 pack-yr hx, quit age 26.  Alc:  1-2 hard liquor drinks a day.  ? Exercise: bicycle  ? Enjoys motorcycles, used to ride [place], has a triumph.  ? ?Social Determinants of Health  ? ?Financial Resource Strain: Low Risk   ? Difficulty of Paying Living Expenses: Not hard at all  ?Food Insecurity: No Food Insecurity  ? Worried About Programme researcher, broadcasting/film/video in the Last Year: Never true  ? Ran Out of Food in the Last Year: Never true  ?Transportation Needs: No Transportation Needs  ? Lack of Transportation (Medical): No  ? Lack of Transportation (Non-Medical): No  ?Physical Activity: Insufficiently Active  ? Days of Exercise per Week: 3 days  ? Minutes of Exercise per Session: 30 min  ?Stress: No Stress Concern Present  ? Feeling of Stress : Not at all  ?Social Connections: Moderately Isolated  ? Frequency of Communication with Friends and Family: More than three times a week  ? Frequency of Social Gatherings with Friends and Family: More than three times a week  ? Attends Religious Services: Never  ? Active Member of Clubs or Organizations: No  ? Attends Banker Meetings: Never  ? Marital Status: Married  ? ? ?Tobacco Counseling ?Counseling given: Not Answered ? ? ?Clinical Intake: ? ?Pre-visit preparation completed: Yes ? ?Pain : No/denies pain ? ?  ? ?BMI - recorded: 24.55 ?Nutritional Status: BMI of 19-24  Normal ?Nutritional Risks: None ?Diabetes: No ? ?How often do you need to have someone help you when you read instructions, pamphlets, or other written materials from your doctor or pharmacy?: 1 - Never ? ?Diabetic?no ? ?Interpreter Needed?: No ? ?Information entered by  :: Lanier Ensign, LPN ? ? ?Activities of Daily Living ? ?  08/09/2021  ?  8:56 AM  ?In your present state of health, do you have any difficulty performing the following activities:  ?Hearing? 0  ?Vision? 0  ?Difficulty concentrating or making decisions? 0  ?Walking or climbing stairs? 0  ?Dressing or bathing? 0  ?Doing errands, shopping? 0  ?Preparing Food and eating ? N  ?Using the Toilet? N  ?In the past six months, have you accidently leaked urine? N  ?Do you  have problems with loss of bowel control? N  ?Managing your Medications? N  ?Managing your Finances? N  ?Housekeeping or managing your Housekeeping? N  ? ? ?Patient Care Team: ?McGowen, Maryjean MornPhilip H, MD as PCP - General (Family Medicine) ?Lenn Sinkegal, Norman S, DPM as Consulting Physician (Podiatry) ?Malvin Johnshabon, Stephen, PA-C as Physician Assistant (Physician Assistant) ?Francena HanlySupple, Kevin, MD as Consulting Physician (Orthopedic Surgery) ?Ollen GrossAluisio, Frank, MD as Consulting Physician (Orthopedic Surgery) ?Sherrilyn Ristanis, Henry L III, MD as Consulting Physician (Gastroenterology) ? ?Indicate any recent Medical Services you may have received from other than [facility] providers in the past year (date may be approximate). ? ?   ?Assessment:  ? This is a routine wellness examination for [place]. ? ?Hearing/Vision screen ?Hearing Screening - Comments:: Pt denies any hearing issues  ?Vision Screening - Comments:: Pt follows up with [facility] for annual eye exams  ? ?Dietary issues and exercise activities discussed: ?Current Exercise Habits: The patient has a physically strenuous job, but has no regular exercise apart from work. ? ? Goals Addressed   ? ?  ?  ?  ?  ? This Visit's Progress  ?  Patient Stated     ?  None at this time  ?  ? ?  ? ?Depression Screen ? ?  08/09/2021  ?  8:54 AM 06/26/2021  ? 10:46 AM 08/03/2020  ?  3:54 PM 06/23/2020  ?  2:39 PM 05/15/2018  ?  2:24 PM 09/09/2017  ?  3:24 PM 03/30/2016  ?  1:27 PM  ?PHQ 2/9 Scores  ?PHQ - 2 Score 0 0 0 0 0 4 6  ?PHQ- 9 Score     0 8 7  ?Exception  Documentation     Medical reason    ?  ?Fall Risk ? ?  08/09/2021  ?  8:56 AM 06/26/2021  ? 10:46 AM 08/03/2020  ?  3:53 PM 06/23/2020  ?  2:39 PM 05/15/2018  ?  2:21 PM  ?Fall Risk   ?Falls in the past year? 0 0 0 0 0  ?N

## 2021-08-09 NOTE — Patient Instructions (Signed)
[name] , ?Thank you for taking time to come for your Medicare Wellness Visit. I appreciate your ongoing commitment to your health goals. Please review the following plan we discussed and let me know if I can assist you in the future.  ? ?Screening recommendations/referrals: ?Colonoscopy: Done 05/04/20 repeat every 3 years  ?Recommended yearly ophthalmology/optometry visit for glaucoma screening and checkup ?Recommended yearly dental visit for hygiene and checkup ? ?Vaccinations: ?Influenza vaccine: Due and discussed  ?Pneumococcal vaccine: Due and discussed  ?Tdap vaccine: Due and discussed  ?Shingles vaccine: Shingrix discussed. Please contact your pharmacy for coverage information.    ?Covid-19: Declined  ? ?Advanced directives: Please bring a copy of your health care power of attorney and living will to the office at your convenience. ? ? ?Conditions/risks identified: None at this time  ? ?Next appointment: Follow up in one year for your annual wellness visit.  ? ?Preventive Care 72 Years and Older, Male ?Preventive care refers to lifestyle choices and visits with your health care provider that can promote health and wellness. ?What does preventive care include? ?A yearly physical exam. This is also called an annual well check. ?Dental exams once or twice a year. ?Routine eye exams. Ask your health care provider how often you should have your eyes checked. ?Personal lifestyle choices, including: ?Daily care of your teeth and gums. ?Regular physical activity. ?Eating a healthy diet. ?Avoiding tobacco and drug use. ?Limiting alcohol use. ?Practicing safe sex. ?Taking low doses of aspirin every day. ?Taking vitamin and mineral supplements as recommended by your health care provider. ?What happens during an annual well check? ?The services and screenings done by your health care provider during your annual well check will depend on your age, overall health, lifestyle risk factors, and family history of  disease. ?Counseling  ?Your health care provider may ask you questions about your: ?Alcohol use. ?Tobacco use. ?Drug use. ?Emotional well-being. ?Home and relationship well-being. ?Sexual activity. ?Eating habits. ?History of falls. ?Memory and ability to understand (cognition). ?Work and work Astronomer. ?Screening  ?You may have the following tests or measurements: ?Height, weight, and BMI. ?Blood pressure. ?Lipid and cholesterol levels. These may be checked every 5 years, or more frequently if you are over 5 years old. ?Skin check. ?Lung cancer screening. You may have this screening every year starting at age 62 if you have a 30-pack-year history of smoking and currently smoke or have quit within the past 15 years. ?Fecal occult blood test (FOBT) of the stool. You may have this test every year starting at age 62. ?Flexible sigmoidoscopy or colonoscopy. You may have a sigmoidoscopy every 5 years or a colonoscopy every 10 years starting at age 62. ?Prostate cancer screening. Recommendations will vary depending on your family history and other risks. ?Hepatitis C blood test. ?Hepatitis B blood test. ?Sexually transmitted disease (STD) testing. ?Diabetes screening. This is done by checking your blood sugar (glucose) after you have not eaten for a while (fasting). You may have this done every 1-3 years. ?Abdominal aortic aneurysm (AAA) screening. You may need this if you are a current or former smoker. ?Osteoporosis. You may be screened starting at age 53 if you are at high risk. ?Talk with your health care provider about your test results, treatment options, and if necessary, the need for more tests. ?Vaccines  ?Your health care provider may recommend certain vaccines, such as: ?Influenza vaccine. This is recommended every year. ?Tetanus, diphtheria, and acellular pertussis (Tdap, Td) vaccine. You may need a  Td booster every 10 years. ?Zoster vaccine. You may need this after age 36. ?Pneumococcal 13-valent  conjugate (PCV13) vaccine. One dose is recommended after age 44. ?Pneumococcal polysaccharide (PPSV23) vaccine. One dose is recommended after age 32. ?Talk to your health care provider about which screenings and vaccines you need and how often you need them. ?This information is not intended to replace advice given to you by your health care provider. Make sure you discuss any questions you have with your health care provider. ?Document Released: 05/06/2015 Document Revised: 12/28/2015 Document Reviewed: 02/08/2015 ?Elsevier Interactive Patient Education ? 2017 [place]. ? ?Fall Prevention in the Home ?Falls can cause injuries. They can happen to people of all ages. There are many things you can do to make your home safe and to help prevent falls. ?What can I do on the outside of my home? ?Regularly fix the edges of walkways and driveways and fix any cracks. ?Remove anything that might make you trip as you walk through a door, such as a raised step or threshold. ?Trim any bushes or trees on the path to your home. ?Use bright outdoor lighting. ?Clear any walking paths of anything that might make someone trip, such as rocks or tools. ?Regularly check to see if handrails are loose or broken. Make sure that both sides of any steps have handrails. ?Any raised decks and porches should have guardrails on the edges. ?Have any leaves, snow, or ice cleared regularly. ?Use sand or salt on walking paths during winter. ?Clean up any spills in your garage right away. This includes oil or grease spills. ?What can I do in the bathroom? ?Use night lights. ?Install grab bars by the toilet and in the tub and shower. Do not use towel bars as grab bars. ?Use non-skid mats or decals in the tub or shower. ?If you need to sit down in the shower, use a plastic, non-slip stool. ?Keep the floor dry. Clean up any water that spills on the floor as soon as it happens. ?Remove soap buildup in the tub or shower regularly. ?Attach bath mats  securely with double-sided non-slip rug tape. ?Do not have throw rugs and other things on the floor that can make you trip. ?What can I do in the bedroom? ?Use night lights. ?Make sure that you have a light by your bed that is easy to reach. ?Do not use any sheets or blankets that are too big for your bed. They should not hang down onto the floor. ?Have a firm chair that has side arms. You can use this for support while you get dressed. ?Do not have throw rugs and other things on the floor that can make you trip. ?What can I do in the kitchen? ?Clean up any spills right away. ?Avoid walking on wet floors. ?Keep items that you use a lot in easy-to-reach places. ?If you need to reach something above you, use a strong step stool that has a grab bar. ?Keep electrical cords out of the way. ?Do not use floor polish or wax that makes floors slippery. If you must use wax, use non-skid floor wax. ?Do not have throw rugs and other things on the floor that can make you trip. ?What can I do with my stairs? ?Do not leave any items on the stairs. ?Make sure that there are handrails on both sides of the stairs and use them. Fix handrails that are broken or loose. Make sure that handrails are as long as the stairways. ?Check any  carpeting to make sure that it is firmly attached to the stairs. Fix any carpet that is loose or worn. ?Avoid having throw rugs at the top or bottom of the stairs. If you do have throw rugs, attach them to the floor with carpet tape. ?Make sure that you have a light switch at the top of the stairs and the bottom of the stairs. If you do not have them, ask someone to add them for you. ?What else can I do to help prevent falls? ?Wear shoes that: ?Do not have high heels. ?Have rubber bottoms. ?Are comfortable and fit you well. ?Are closed at the toe. Do not wear sandals. ?If you use a stepladder: ?Make sure that it is fully opened. Do not climb a closed stepladder. ?Make sure that both sides of the stepladder  are locked into place. ?Ask someone to hold it for you, if possible. ?Clearly Jaimie and make sure that you can see: ?Any grab bars or handrails. ?First and last steps. ?Where the edge of each step is. ?Use tools that help

## 2021-08-10 ENCOUNTER — Encounter: Payer: Self-pay | Admitting: Family Medicine

## 2021-08-10 ENCOUNTER — Ambulatory Visit (INDEPENDENT_AMBULATORY_CARE_PROVIDER_SITE_OTHER): Payer: Medicare Other | Admitting: Family Medicine

## 2021-08-10 VITALS — BP 119/76 | HR 67 | Temp 98.1°F | Ht 69.5 in | Wt 161.0 lb

## 2021-08-10 DIAGNOSIS — M7121 Synovial cyst of popliteal space [Baker], right knee: Secondary | ICD-10-CM

## 2021-08-10 MED ORDER — AMPHETAMINE-DEXTROAMPHETAMINE 10 MG PO TABS: 10.0000 mg | ORAL_TABLET | Freq: Two times a day (BID) | ORAL | 0 refills | Status: DC

## 2021-08-10 MED ORDER — SILDENAFIL CITRATE 20 MG PO TABS: ORAL_TABLET | ORAL | 2 refills | Status: AC

## 2021-08-10 NOTE — Progress Notes (Signed)
OFFICE VISIT ? ?08/10/2021 ? ?CC:  ?Chief Complaint  ?Patient presents with  ? Cyst  ?  Follow up;   ? ? ?Patient is a 71 y.o. male who presents for f/u right leg baker's cyst. ?A/P as of last visit: ?"#1 ADD, stable. ?Continue Adderall 10 mg twice daily.  New prescription for April 2023 sent in today for pharmacy to hold. ?  ?#2 penile curvature.  He has some decreased ability to obtain and maintain an erection and request refill of sildenafil.  He also requests referral to urologist--ordered today. ? ?3.  Right leg popliteal cyst. ?Bedside ultrasound today showed large anechoic lesion measuring 15 cm craniocaudal, about 2 cm width and 2 cm deep.  Patient will return at his convenience for aspiration." ? ?INTERIM HX: ?No changes in swelling in the back of right knee.  No pain. ?He feels well. ? ? ?PMP AWARE reviewed today: most recent rx for adderall was filled 07/12/21, # 60, rx by me. ?No red flags. ? ?Past Medical History:  ?Diagnosis Date  ? Adult ADHD   ? Cataract   ? Chronic pain of right knee 11/2018  ? osteoarthritis + pes anserine bursitis (PT as of 12/2018)  ? Colon cancer screening 03/2016  ? 2017 Cologuard NEG.  2020 FIT test NEG 12/22/17. FIT POS 12/2019-->colonoscopy normal.  ? Diverticulosis 04/2020  ? noted on colonoscopy done for + FIT screening (colonoscopy o/w normal).  ? Former smoker   ? Glenohumeral arthritis, right 2020  ? advanced as of Dr. Dub Mikes 11/2018 eval.  ? 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 Quantiferon gold assay POSITIVE 02/2015.  With his positive skin test he was treated for latent TB with INH but admits he wasn't fully compliant with the med (CXR pending as of 03/24/15).  ? History of rib fracture   ? Multiple on L side (remote past + 04/2015)  ? Multiple closed fractures of metatarsal bone of right foot   ? 2nd and 5th metatarsals  ? Prediabetes   ? A1c 5.9% 08/2017  ? Secondary male hypogonadism   ? clomid 04/2019--helpful  ? ? ?Past Surgical History:  ?Procedure Laterality Date  ?  COLONOSCOPY  05/04/2020  ? NORMAL  ? SEPTOPLASTY  2010  ? deviated septum, per pt  ? ? ?Outpatient Medications Prior to Visit  ?Medication Sig Dispense Refill  ? amphetamine-dextroamphetamine (ADDERALL) 10 MG tablet Take 1 tablet (10 mg total) by mouth 2 (two) times daily. 60 tablet 0  ? sildenafil (REVATIO) 20 MG tablet 1-2 qd prn intercourse 10 tablet 2  ? ?No facility-administered medications prior to visit.  ? ? ?No Known Allergies ? ?ROS ?As per HPI ? ?PE: ? ?  08/10/2021  ?  3:04 PM 06/26/2021  ? 10:41 AM 10/13/2020  ?  9:36 AM  ?Vitals with BMI  ?Height 5' 9.5" 5' 9.5" 5' 9.5"  ?Weight 161 lbs 168 lbs 10 oz 167 lbs 10 oz  ?BMI 23.44 24.55 24.4  ?Systolic 119 128 270  ?Diastolic 76 77 81  ?Pulse 67 63 61  ? ? ? ?Physical Exam ? ?General: Alert and well-appearing. ?Left medial popliteal region with focal palpable swelling, mildly fluctuant nontender and nonerythematous. ? ?LABS:  ?None ? ?IMPRESSION AND PLAN: ? ?Right leg popliteal cyst. ?Visualized large anechoic distention of bursa between the semimembranosus and medial head of gastroc. A bit of echogenic debris is noted dispersed throughout this cyst. ?It is partially compressible.  No hyperemia with PDI. ? ?PROCEDURE: ?Ultrasound-guided popliteal  cyst aspiration. ?Device: GE LogiQ E ?Verbal informed consent obtained.  Timeout conducted.  No overlying erythema, induration, or other signs of local infection. ?Real-time ultrasound guided aspiration with 18-gauge needle and 60 cc syringe performed.  25 cc straw-colored fluid obtained.  No blood.  Small amount of cystic contents were still present after aspiration was completed.  I did not do steroid injection today. ?Patient tolerated the procedure well.  No immediate complications.  Post procedure care discussed.  Advised to call if fever/chills, erythema, drainage, or persistent bleeding. ? ?Impression: Technically successful ultrasound-guided popliteal cyst aspiration. ?Sent fluid for culture, cell count, and  crystal analysis. ? ?An After Visit Summary was printed and given to the patient. ? ?FOLLOW UP: Return in about 5 months (around 01/10/2022) for f/u ADD. ? ?Signed:  Santiago Bumpers, MD           08/10/2021 ? ? ?

## 2021-08-14 LAB — BODY FLUID CULTURE

## 2021-08-21 ENCOUNTER — Encounter: Payer: Self-pay | Admitting: Family Medicine

## 2021-09-12 LAB — SYNOVIAL FLUID, CELL COUNT

## 2021-09-12 LAB — SPECIMEN STATUS REPORT

## 2021-09-20 ENCOUNTER — Other Ambulatory Visit: Payer: Self-pay | Admitting: Family Medicine

## 2021-09-20 NOTE — Telephone Encounter (Signed)
Requesting: adderall Contract: 08/09/21 UDS: 12/24/19 Last Visit: 08/10/21, acute & 06/26/21 RCI Next Visit: advised to f/u 6 mo Last Refill: 08/10/21(60,0)  Please Advise. Med pending

## 2021-11-13 ENCOUNTER — Other Ambulatory Visit: Payer: Self-pay | Admitting: Family Medicine

## 2022-01-09 ENCOUNTER — Other Ambulatory Visit: Payer: Self-pay | Admitting: Family Medicine

## 2022-01-09 NOTE — Telephone Encounter (Signed)
Requesting: adderall Contract: 08/09/21 UDS: 12/24/19 Last Visit: 08/10/21 Next Visit: 5 mo f/u ADD, not completed Last Refill: 11/13/21(60,0)  Please Advise. Med pending

## 2022-01-10 NOTE — Telephone Encounter (Signed)
Pt aware this is last refill until appt made. Pt will cb for appt

## 2022-01-10 NOTE — Telephone Encounter (Signed)
Adderall prescription sent. He is due for follow-up in the next month.

## 2022-03-05 ENCOUNTER — Ambulatory Visit (INDEPENDENT_AMBULATORY_CARE_PROVIDER_SITE_OTHER): Payer: Medicare Other | Admitting: Family Medicine

## 2022-03-05 ENCOUNTER — Encounter: Payer: Self-pay | Admitting: Family Medicine

## 2022-03-05 VITALS — BP 129/83 | HR 61 | Temp 97.9°F | Ht 69.5 in | Wt 162.8 lb

## 2022-03-05 DIAGNOSIS — Z23 Encounter for immunization: Secondary | ICD-10-CM | POA: Diagnosis not present

## 2022-03-05 DIAGNOSIS — F988 Other specified behavioral and emotional disorders with onset usually occurring in childhood and adolescence: Secondary | ICD-10-CM

## 2022-03-05 DIAGNOSIS — Z79899 Other long term (current) drug therapy: Secondary | ICD-10-CM

## 2022-03-05 DIAGNOSIS — F341 Dysthymic disorder: Secondary | ICD-10-CM | POA: Diagnosis not present

## 2022-03-05 MED ORDER — AMPHETAMINE-DEXTROAMPHETAMINE 10 MG PO TABS: 10.0000 mg | ORAL_TABLET | Freq: Two times a day (BID) | ORAL | 0 refills | Status: DC

## 2022-03-05 NOTE — Progress Notes (Signed)
OFFICE VISIT  03/05/2022  CC:  Chief Complaint  Patient presents with   ADD    Patient is a 71 y.o. male who presents for 85-month follow-up adult ADD.  INTERIM HX: He is considering trial of antidepressant as he feels like over the years his mood has gradually decreased and he has had drop in motivation.  Says he has been like this all his life and he has been used to it and has never been medicated.  However it seems worse lately.  He is not ready to try 1 yet though.  Pt states all is going well with the med at current dosing (adderall 10mg  bid): much improved focus, concentration, task completion.  Less frustration, better multitasking, less impulsivity and restlessness.  Mood is stable. No side effects from the medication.   PMP AWARE reviewed today: most recent rx for Adderall was filled 01/10/2022, #60, rx by me. No red flags.  Past Medical History:  Diagnosis Date   Adult ADHD    Baker's cyst    R.  Aspiration 07/2021   Cataract    Chronic pain of right knee 11/2018   osteoarthritis + pes anserine bursitis (PT as of 12/2018)   Colon cancer screening 03/2016   2017 Cologuard NEG.  2020 FIT test NEG 12/22/17. FIT POS 12/2019-->colonoscopy normal.   Diverticulosis 04/2020   noted on colonoscopy done for + FIT screening (colonoscopy o/w normal).   Former smoker    Glenohumeral arthritis, right 2020   advanced as of Dr. 2021 11/2018 eval.   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  With his positive skin test he was treated for latent TB with INH but admits he wasn't fully compliant with the med (CXR pending as of 03/24/15).   History of rib fracture    Multiple on L side (remote past + 04/2015)   Multiple closed fractures of metatarsal bone of right foot    2nd and 5th metatarsals   Prediabetes    A1c 5.9% 08/2017   Secondary male hypogonadism    clomid 04/2019--helpful    Past Surgical History:  Procedure Laterality Date   COLONOSCOPY   05/04/2020   NORMAL   SEPTOPLASTY  2010   deviated septum, per pt    Outpatient Medications Prior to Visit  Medication Sig Dispense Refill   amphetamine-dextroamphetamine (ADDERALL) 10 MG tablet TAKE ONE TABLET BY MOUTH TWICE DAILY 60 tablet 0   sildenafil (REVATIO) 20 MG tablet 1-2 qd prn intercourse 10 tablet 2   No facility-administered medications prior to visit.    No Known Allergies  ROS As per HPI  PE:    03/05/2022   10:18 AM 08/10/2021    3:04 PM 06/26/2021   10:41 AM  Vitals with BMI  Height 5' 9.5" 5' 9.5" 5' 9.5"  Weight 162 lbs 13 oz 161 lbs 168 lbs 10 oz  BMI 23.7 23.44 24.55  Systolic 129 119 08/26/2021  Diastolic 83 76 77  Pulse 61 67 63   Physical Exam  Wt Readings from Last 2 Encounters:  03/05/22 162 lb 12.8 oz (73.8 kg)  08/10/21 161 lb (73 kg)    Gen: alert, oriented x 4, affect pleasant.  Lucid thinking and conversation noted. HEENT: PERRLA, EOMI.   Neck: no LAD, mass, or thyromegaly. CV: RRR, no m/r/g LUNGS: CTA bilat, nonlabored. NEURO: no tremor or tics noted on observation.  Coordination intact. CN 2-12 grossly intact bilaterally, strength 5/5 in all extremeties.  No ataxia.   LABS:  Last CBC Lab Results  Component Value Date   WBC 6.0 12/24/2019   HGB 13.7 12/24/2019   HCT 41.0 12/24/2019   MCV 94.0 12/24/2019   RDW 12.7 12/24/2019   PLT 207.0 [id number]   Last metabolic panel Lab Results  Component Value Date   GLUCOSE 72 12/24/2019   NA 138 12/24/2019   K 4.4 12/24/2019   CL 103 12/24/2019   CO2 30 12/24/2019   BUN 20 12/24/2019   CREATININE 1.20 12/24/2019   CALCIUM 9.1 12/24/2019   PROT 7.4 08/29/2017   ALBUMIN 3.9 08/29/2017   BILITOT 0.6 08/29/2017   ALKPHOS 43 08/29/2017   AST 18 08/29/2017   ALT 13 08/29/2017   Last hemoglobin A1c Lab Results  Component Value Date   HGBA1C 6.0 12/24/2019   IMPRESSION AND PLAN:  #1 dysthymia, worsening lately. He will continue to consider trial of antidepressant.  If we were  to do this we both decided today fluoxetine would be a good choice.  #2 adult ADD. Doing well long-term on Adderall 10 mg twice daily.  We will continue this. Controlled substance contract up-to-date. Urine drug screen today.    An After Visit Summary was printed and given to the patient.  FOLLOW UP: Return in about 6 months (around 09/03/2022).  Signed:  Crissie Sickles, MD           03/05/2022

## 2022-03-08 LAB — DRUG MONITORING PANEL 376104, URINE
Amphetamine: 5792 ng/mL — ABNORMAL HIGH (ref <250)
Amphetamines: POSITIVE ng/mL — AB (ref <500)
Barbiturates: NEGATIVE ng/mL (ref <300)
Benzodiazepines: NEGATIVE ng/mL (ref <100)
Cocaine Metabolite: NEGATIVE ng/mL (ref <150)
Desmethyltramadol: NEGATIVE ng/mL (ref <100)
Methamphetamine: NEGATIVE ng/mL (ref <250)
Opiates: NEGATIVE ng/mL (ref <100)
Oxycodone: NEGATIVE ng/mL (ref <100)
Tramadol: NEGATIVE ng/mL (ref <100)

## 2022-03-08 LAB — DM TEMPLATE

## 2022-04-28 ENCOUNTER — Other Ambulatory Visit: Payer: Self-pay | Admitting: Family Medicine

## 2022-04-30 NOTE — Telephone Encounter (Signed)
Requesting: adderall Contract: 08/09/21 UDS: 03/05/22 Last Visit: 03/05/22 Next Visit: advised to f/u 6 mo Last Refill: 03/05/22 (60,0)  Please Advise. Med pending

## 2022-05-25 DIAGNOSIS — H2513 Age-related nuclear cataract, bilateral: Secondary | ICD-10-CM | POA: Diagnosis not present

## 2022-05-25 DIAGNOSIS — H40013 Open angle with borderline findings, low risk, bilateral: Secondary | ICD-10-CM | POA: Diagnosis not present

## 2022-05-25 DIAGNOSIS — H25013 Cortical age-related cataract, bilateral: Secondary | ICD-10-CM | POA: Diagnosis not present

## 2022-05-25 DIAGNOSIS — H25043 Posterior subcapsular polar age-related cataract, bilateral: Secondary | ICD-10-CM | POA: Diagnosis not present

## 2022-06-01 ENCOUNTER — Other Ambulatory Visit: Payer: Self-pay | Admitting: Family Medicine

## 2022-06-01 NOTE — Telephone Encounter (Signed)
Requesting: adderall Contract: 08/09/21 UDS: 03/05/22 Last Visit: 03/05/22 Next Visit: 6 mo f/u  Last Refill: 05/01/22 (60,0)  Please Advise. Med pending

## 2022-07-14 ENCOUNTER — Other Ambulatory Visit: Payer: Self-pay | Admitting: Family Medicine

## 2022-07-16 NOTE — Telephone Encounter (Signed)
Requesting: adderall  Contract: 08/09/21 UDS: 03/05/22 Last Visit: 03/05/22 Next Visit: 6 mo f/u RCI Last Refill: 06/01/22 (60,0)  Please Advise. Med pending

## 2022-08-23 ENCOUNTER — Other Ambulatory Visit: Payer: Self-pay | Admitting: Family Medicine

## 2022-08-23 NOTE — Telephone Encounter (Signed)
Requesting: adderall Contract: 07/30/21 UDS: 03/05/22 Last Visit: 03/05/22 Next Visit:6 mo RCI Last Refill: 07/16/22(60,0)  Please Advise. Med pending

## 2022-08-23 NOTE — Telephone Encounter (Signed)
Refill sent. Needs follow-up 1 month

## 2022-08-28 ENCOUNTER — Telehealth: Payer: Self-pay | Admitting: Family Medicine

## 2022-08-28 NOTE — Telephone Encounter (Signed)
Contacted Clinton Adams to schedule their annual wellness visit. Appointment made for 09/12/2022.  [name] AWV TEAM Direct Dial [phone]

## 2022-09-12 ENCOUNTER — Ambulatory Visit (INDEPENDENT_AMBULATORY_CARE_PROVIDER_SITE_OTHER): Payer: Medicare Other

## 2022-09-12 VITALS — Wt 162.0 lb

## 2022-09-12 DIAGNOSIS — Z0000 Encounter for general adult medical examination without abnormal findings: Secondary | ICD-10-CM

## 2022-09-12 NOTE — Progress Notes (Signed)
I connected with  Clinton Adams on 09/12/22 by a audio enabled telemedicine application and verified that I am speaking with the correct person using two identifiers.  Patient Location: Home  Provider Location: Home Office  I discussed the limitations of evaluation and management by telemedicine. The patient expressed understanding and agreed to proceed.   Subjective:   Clinton Adams is a 72 y.o. male who presents for Medicare Annual/Subsequent preventive examination.  Review of Systems     Cardiac Risk Factors include: advanced age (>32men, >19 women);male gender     Objective:    Today's Vitals   09/12/22 1434  Weight: 162 lb (73.5 kg)   Body mass index is 23.58 kg/m.     09/12/2022    2:38 PM 08/09/2021    8:55 AM 08/03/2020    3:53 PM 09/09/2017    3:24 PM 03/30/2016    1:26 PM  Advanced Directives  Does Patient Have a Medical Advance Directive? No Yes No No No  Type of Passenger transport manager of Healthcare Power of Attorney in Chart?  No - copy requested     Would patient like information on creating a medical advance directive? No - Patient declined  No - Patient declined No - Patient declined No - Patient declined    Current Medications (verified) Outpatient Encounter Medications as of 09/12/2022  Medication Sig   amphetamine-dextroamphetamine (ADDERALL) 10 MG tablet TAKE ONE TABLET BY MOUTH TWICE DAILY   sildenafil (REVATIO) 20 MG tablet 1-2 qd prn intercourse   No facility-administered encounter medications on file as of 09/12/2022.    Allergies (verified) Patient has no known allergies.   History: Past Medical History:  Diagnosis Date   Adult ADHD    Baker's cyst    R.  Aspiration 07/2021   Cataract    Chronic pain of right knee 11/2018   osteoarthritis + pes anserine bursitis (PT as of 12/2018)   Colon cancer screening 03/2016   2017 Cologuard NEG.  2020 FIT test NEG 12/22/17. FIT POS 12/2019-->colonoscopy normal.    Diverticulosis 04/2020   noted on colonoscopy done for + FIT screening (colonoscopy o/w normal).   Dysthymia    Former smoker    Glenohumeral arthritis, right 2020   advanced as of Dr. Dub Mikes 11/2018 eval.   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  With his positive skin test he was treated for latent TB with INH but admits he wasn't fully compliant with the med (CXR pending as of 03/24/15).   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   Multiple closed fractures of metatarsal bone of right foot    2nd and 5th metatarsals   Prediabetes    A1c 5.9% 08/2017   Secondary male hypogonadism    clomid 04/2019--helpful   Ventricular bigeminy    Past Surgical History:  Procedure Laterality Date   COLONOSCOPY  05/04/2020   NORMAL   SEPTOPLASTY  2010   deviated septum, per pt   Family History  Problem Relation Age of Onset   Hypertension Mother    Lung cancer Father    Heart disease Father    Colon cancer Neg Hx    Esophageal cancer Neg Hx    Rectal cancer Neg Hx    Stomach cancer Neg Hx    Social History  Socioeconomic History   Marital status: Married    Spouse name: Not on file   Number of children: Not on file   Years of education: Not on file   Highest education level: Not on file  Occupational History   Not on file  Tobacco Use   Smoking status: Former    Packs/day: 1.00    Years: 30.00    Additional pack years: 0.00    Total pack years: 30.00    Types: Cigarettes    Quit date: 04/23/1994    Years since quitting: 28.4   Smokeless tobacco: Never  Vaping Use   Vaping Use: Never used  Substance and Sexual Activity   Alcohol use: Yes    Comment: daily   Drug use: No   Sexual activity: Not on file  Other Topics Concern   Not on file  Social History Narrative   Married, one daughter.  No grandchilden.   Occup: Retired PA, CV surg/trauma surg/ortho, some FP.    Owns [employer] in [place], [place].   Former smoker; 30 pack-yr hx, quit age 29.  Alc:  1-2 hard liquor drinks a day.   Exercise: bicycle   Enjoys motorcycles, used to ride [place], has a triumph.   Social Determinants of Health   Financial Resource Strain: Low Risk  (09/12/2022)   Overall Financial Resource Strain (CARDIA)    Difficulty of Paying Living Expenses: Not hard at all  Food Insecurity: No Food Insecurity (09/12/2022)   Hunger Vital Sign    Worried About Running Out of Food in the Last Year: Never true    Ran Out of Food in the Last Year: Never true  Transportation Needs: No Transportation Needs (09/12/2022)   PRAPARE - Administrator, Civil Service (Medical): No    Lack of Transportation (Non-Medical): No  Physical Activity: Inactive (09/12/2022)   Exercise Vital Sign    Days of Exercise per Week: 0 days    Minutes of Exercise per Session: 0 min  Stress: No Stress Concern Present (09/12/2022)   [employer] of Occupational Health - Occupational Stress Questionnaire    Feeling of Stress : Not at all  Social Connections: Moderately Isolated (09/12/2022)   Social Connection and Isolation Panel [NHANES]    Frequency of Communication with Friends and Family: More than three times a week    Frequency of Social Gatherings with Friends and Family: More than three times a week    Attends Religious Services: Never    Database administrator or Organizations: No    Attends Engineer, structural: Never    Marital Status: Married    Tobacco Counseling Counseling given: Not Answered   Clinical Intake:  Pre-visit preparation completed: Yes  Pain : No/denies pain     BMI - recorded: 23.58 Nutritional Status: BMI of 19-24  Normal Nutritional Risks: None Diabetes: No  How often do you need to have someone help you when you read instructions, pamphlets, or other written materials from your doctor or pharmacy?: 1 - Never  Diabetic?no  Interpreter Needed?:  No  Information entered by :: Lanier Ensign, LPN   Activities of Daily Living    09/12/2022    2:38 PM  In your present state of health, do you have any difficulty performing the following activities:  Hearing? 0  Vision? 0  Difficulty concentrating or making decisions? 0  Walking or climbing stairs? 0  Dressing or bathing? 0  Doing errands, shopping? 0  Preparing  Food and eating ? N  Using the Toilet? N  In the past six months, have you accidently leaked urine? N  Do you have problems with loss of bowel control? N  Managing your Medications? N  Managing your Finances? N  Housekeeping or managing your Housekeeping? N    Patient Care Team: Jeoffrey Massed, MD as PCP - General (Family Medicine) Regal, Kirstie Peri, DPM as Consulting Physician (Podiatry) Malvin Johns as Physician Assistant (Physician Assistant) Francena Hanly, MD as Consulting Physician (Orthopedic Surgery) Ollen Gross, MD as Consulting Physician (Orthopedic Surgery) Myrtie Neither Andreas Blower, MD as Consulting Physician (Gastroenterology)  Indicate any recent Medical Services you may have received from other than [facility] providers in the past year (date may be approximate).     Assessment:   This is a routine wellness examination for Clinton Adams.  Hearing/Vision screen Hearing Screening - Comments:: Pt denies any hearing issues Vision Screening - Comments:: Pt follows up with dr Elyn Peers for annual eye exams   Dietary issues and exercise activities discussed: Current Exercise Habits: The patient does not participate in regular exercise at present   Goals Addressed             This Visit's Progress    Patient Stated       Live long       Depression Screen    09/12/2022    2:37 PM 08/09/2021    8:54 AM 06/26/2021   10:46 AM 08/03/2020    3:54 PM 06/23/2020    2:39 PM 05/15/2018    2:24 PM 09/09/2017    3:24 PM  PHQ 2/9 Scores  PHQ - 2 Score 0 0 0 0 0 0 4  PHQ- 9 Score      0 8  Exception Documentation       Medical reason     Fall Risk    09/12/2022    2:38 PM 08/09/2021    8:56 AM 06/26/2021   10:46 AM 08/03/2020    3:53 PM 06/23/2020    2:39 PM  Fall Risk   Falls in the past year? 0 0 0 0 0  Number falls in past yr: 0 0 0 0 0  Injury with Fall? 0 0 0 0 0  Risk for fall due to : Impaired vision Impaired vision     Follow up Falls prevention discussed Falls prevention discussed Falls evaluation completed Falls prevention discussed     FALL RISK PREVENTION PERTAINING TO THE HOME:  Any stairs in or around the home? Yes  If so, are there any without handrails? No  Home free of loose throw rugs in walkways, pet beds, electrical cords, etc? Yes  Adequate lighting in your home to reduce risk of falls? Yes   ASSISTIVE DEVICES UTILIZED TO PREVENT FALLS:  Life alert? No  Use of a cane, walker or w/c? No  Grab bars in the bathroom? No  Shower chair or bench in shower? No  Elevated toilet seat or a handicapped toilet? No   TIMED UP AND GO:  Was the test performed? No .   Cognitive Function:        09/12/2022    2:39 PM 08/09/2021    8:57 AM  6CIT Screen  What Year? 0 points 0 points  What month? 0 points 0 points  What time? 0 points 0 points  Count back from 20 0 points 0 points  Months in reverse 0 points 0 points  Repeat phrase 0 points 0  points  Total Score 0 points 0 points    Immunizations Immunization History  Administered Date(s) Administered   Fluad Quad(high Dose 65+) 03/05/2022   Influenza, High Dose Seasonal PF 03/25/2017, 01/27/2018, 01/21/2019   Influenza-Unspecified 03/08/2016    TDAP status: Due, Education has been provided regarding the importance of this vaccine. Advised may receive this vaccine at local pharmacy or Health Dept. Aware to provide a copy of the vaccination record if obtained from local pharmacy or Health Dept. Verbalized acceptance and understanding.  Flu Vaccine status: Declined, Education has been provided regarding the importance of  this vaccine but patient still declined. Advised may receive this vaccine at local pharmacy or Health Dept. Aware to provide a copy of the vaccination record if obtained from local pharmacy or Health Dept. Verbalized acceptance and understanding.  Pneumococcal vaccine status: Declined,  Education has been provided regarding the importance of this vaccine but patient still declined. Advised may receive this vaccine at local pharmacy or Health Dept. Aware to provide a copy of the vaccination record if obtained from local pharmacy or Health Dept. Verbalized acceptance and understanding.   Covid-19 vaccine status: Declined, Education has been provided regarding the importance of this vaccine but patient still declined. Advised may receive this vaccine at local pharmacy or Health Dept.or vaccine clinic. Aware to provide a copy of the vaccination record if obtained from local pharmacy or Health Dept. Verbalized acceptance and understanding.  Qualifies for Shingles Vaccine? No    Screening Tests Health Maintenance  Topic Date Due   DTaP/Tdap/Td (1 - Tdap) Never done   COVID-19 Vaccine (1) 09/28/2022 (Originally 12/16/1955)   Zoster Vaccines- Shingrix (1 of 2) 12/13/2022 (Originally 12/15/1969)   COLON CANCER SCREENING ANNUAL FOBT  03/15/2023 (Originally 01/04/2021)   Pneumonia Vaccine 7+ Years old (1 of 1 - PCV) 09/12/2023 (Originally 12/16/2014)   INFLUENZA VACCINE  11/22/2022   COLONOSCOPY (Pts 45-58yrs Insurance coverage will need to be confirmed)  05/05/2023   Medicare Annual Wellness (AWV)  09/12/2023   Hepatitis C Screening  Completed   HPV VACCINES  Aged Out    Health Maintenance  Health Maintenance Due  Topic Date Due   DTaP/Tdap/Td (1 - Tdap) Never done    Colorectal cancer screening: No longer required.  Per pt    Additional Screening:  Hepatitis C Screening:  Completed 05/15/21  Vision Screening: Recommended annual ophthalmology exams for early detection of glaucoma and other  disorders of the eye. Is the patient up to date with their annual eye exam?  Yes  Who is the provider or what is the name of the office in which the patient attends annual eye exams? Dr Elyn Peers If pt is not established with a provider, would they like to be referred to a provider to establish care? No .   Dental Screening: Recommended annual dental exams for proper oral hygiene  Community Resource Referral / Chronic Care Management: CRR required this visit?  No   CCM required this visit?  No      Plan:     I have personally reviewed and noted the following in the patient's chart:   Medical and social history Use of alcohol, tobacco or illicit drugs  Current medications and supplements including opioid prescriptions. Patient is not currently taking opioid prescriptions. Functional ability and status Nutritional status Physical activity Advanced directives List of other physicians Hospitalizations, surgeries, and ER visits in previous 12 months Vitals Screenings to include cognitive, depression, and falls Referrals and appointments  In addition,  I have reviewed and discussed with patient certain preventive protocols, quality metrics, and best practice recommendations. A written personalized care plan for preventive services as well as general preventive health recommendations were provided to patient.     Marzella Schlein, LPN   1/61/0960   Nurse Notes: none

## 2022-09-12 NOTE — Patient Instructions (Signed)
[name] , Thank you for taking time to come for your Medicare Wellness Visit. I appreciate your ongoing commitment to your health goals. Please review the following plan we discussed and let me know if I can assist you in the future.   These are the goals we discussed:  Goals      Patient Stated     Maintain current health.      Patient Stated     None at this time      Patient Stated     Live long        This is a list of the screening recommended for you and due dates:  Health Maintenance  Topic Date Due   COVID-19 Vaccine (1) Never done   DTaP/Tdap/Td vaccine (1 - Tdap) Never done   Zoster (Shingles) Vaccine (1 of 2) Never done   Pneumonia Vaccine (1 of 1 - PCV) Never done   Stool Blood Test  01/04/2021   Flu Shot  11/22/2022   Colon Cancer Screening  05/05/2023   Medicare Annual Wellness Visit  09/12/2023   Hepatitis C Screening: USPSTF Recommendation to screen - Ages 18-79 yo.  Completed   HPV Vaccine  Aged Out    Advanced directives: Advance directive discussed with you today. Even though you declined this today please call our office should you change your mind and we can give you the proper paperwork for you to fill out.   Conditions/risks identified: live long   Next appointment: Follow up in one year for your annual wellness visit.   Preventive Care 73 Years and Older, Male  Preventive care refers to lifestyle choices and visits with your health care provider that can promote health and wellness. What does preventive care include? A yearly physical exam. This is also called an annual well check. Dental exams once or twice a year. Routine eye exams. Ask your health care provider how often you should have your eyes checked. Personal lifestyle choices, including: Daily care of your teeth and gums. Regular physical activity. Eating a healthy diet. Avoiding tobacco and drug use. Limiting alcohol use. Practicing safe sex. Taking low doses of aspirin every  day. Taking vitamin and mineral supplements as recommended by your health care provider. What happens during an annual well check? The services and screenings done by your health care provider during your annual well check will depend on your age, overall health, lifestyle risk factors, and family history of disease. Counseling  Your health care provider may ask you questions about your: Alcohol use. Tobacco use. Drug use. Emotional well-being. Home and relationship well-being. Sexual activity. Eating habits. History of falls. Memory and ability to understand (cognition). Work and work Astronomer. Screening  You may have the following tests or measurements: Height, weight, and BMI. Blood pressure. Lipid and cholesterol levels. These may be checked every 5 years, or more frequently if you are over 11 years old. Skin check. Lung cancer screening. You may have this screening every year starting at age 73 if you have a 30-pack-year history of smoking and currently smoke or have quit within the past 15 years. Fecal occult blood test (FOBT) of the stool. You may have this test every year starting at age 73. Flexible sigmoidoscopy or colonoscopy. You may have a sigmoidoscopy every 5 years or a colonoscopy every 10 years starting at age 73. Prostate cancer screening. Recommendations will vary depending on your family history and other risks. Hepatitis C blood test. Hepatitis B blood test. Sexually  transmitted disease (STD) testing. Diabetes screening. This is done by checking your blood sugar (glucose) after you have not eaten for a while (fasting). You may have this done every 1-3 years. Abdominal aortic aneurysm (AAA) screening. You may need this if you are a current or former smoker. Osteoporosis. You may be screened starting at age 73 if you are at high risk. Talk with your health care provider about your test results, treatment options, and if necessary, the need for more  tests. Vaccines  Your health care provider may recommend certain vaccines, such as: Influenza vaccine. This is recommended every year. Tetanus, diphtheria, and acellular pertussis (Tdap, Td) vaccine. You may need a Td booster every 10 years. Zoster vaccine. You may need this after age 73. Pneumococcal 13-valent conjugate (PCV13) vaccine. One dose is recommended after age 73. Pneumococcal polysaccharide (PPSV23) vaccine. One dose is recommended after age 73. Talk to your health care provider about which screenings and vaccines you need and how often you need them. This information is not intended to replace advice given to you by your health care provider. Make sure you discuss any questions you have with your health care provider. Document Released: 05/06/2015 Document Revised: 12/28/2015 Document Reviewed: 02/08/2015 Elsevier Interactive Patient Education  2017 [employer].  Fall Prevention in the Home Falls can cause injuries. They can happen to people of all ages. There are many things you can do to make your home safe and to help prevent falls. What can I do on the outside of my home? Regularly fix the edges of walkways and driveways and fix any cracks. Remove anything that might make you trip as you walk through a door, such as a raised step or threshold. Trim any bushes or trees on the path to your home. Use bright outdoor lighting. Clear any walking paths of anything that might make someone trip, such as rocks or tools. Regularly check to see if handrails are loose or broken. Make sure that both sides of any steps have handrails. Any raised decks and porches should have guardrails on the edges. Have any leaves, snow, or ice cleared regularly. Use sand or salt on walking paths during winter. Clean up any spills in your garage right away. This includes oil or grease spills. What can I do in the bathroom? Use night lights. Install grab bars by the toilet and in the tub and shower.  Do not use towel bars as grab bars. Use non-skid mats or decals in the tub or shower. If you need to sit down in the shower, use a plastic, non-slip stool. Keep the floor dry. Clean up any water that spills on the floor as soon as it happens. Remove soap buildup in the tub or shower regularly. Attach bath mats securely with double-sided non-slip rug tape. Do not have throw rugs and other things on the floor that can make you trip. What can I do in the bedroom? Use night lights. Make sure that you have a light by your bed that is easy to reach. Do not use any sheets or blankets that are too big for your bed. They should not hang down onto the floor. Have a firm chair that has side arms. You can use this for support while you get dressed. Do not have throw rugs and other things on the floor that can make you trip. What can I do in the kitchen? Clean up any spills right away. Avoid walking on wet floors. Keep items that you use  a lot in easy-to-reach places. If you need to reach something above you, use a strong step stool that has a grab bar. Keep electrical cords out of the way. Do not use floor polish or wax that makes floors slippery. If you must use wax, use non-skid floor wax. Do not have throw rugs and other things on the floor that can make you trip. What can I do with my stairs? Do not leave any items on the stairs. Make sure that there are handrails on both sides of the stairs and use them. Fix handrails that are broken or loose. Make sure that handrails are as long as the stairways. Check any carpeting to make sure that it is firmly attached to the stairs. Fix any carpet that is loose or worn. Avoid having throw rugs at the top or bottom of the stairs. If you do have throw rugs, attach them to the floor with carpet tape. Make sure that you have a light switch at the top of the stairs and the bottom of the stairs. If you do not have them, ask someone to add them for you. What else  can I do to help prevent falls? Wear shoes that: Do not have high heels. Have rubber bottoms. Are comfortable and fit you well. Are closed at the toe. Do not wear sandals. If you use a stepladder: Make sure that it is fully opened. Do not climb a closed stepladder. Make sure that both sides of the stepladder are locked into place. Ask someone to hold it for you, if possible. Clearly mark and make sure that you can see: Any grab bars or handrails. First and last steps. Where the edge of each step is. Use tools that help you move around (mobility aids) if they are needed. These include: Canes. Walkers. Scooters. Crutches. Turn on the lights when you go into a dark area. Replace any light bulbs as soon as they burn out. Set up your furniture so you have a clear path. Avoid moving your furniture around. If any of your floors are uneven, fix them. If there are any pets around you, be aware of where they are. Review your medicines with your doctor. Some medicines can make you feel dizzy. This can increase your chance of falling. Ask your doctor what other things that you can do to help prevent falls. This information is not intended to replace advice given to you by your health care provider. Make sure you discuss any questions you have with your health care provider. Document Released: 02/03/2009 Document Revised: 09/15/2015 Document Reviewed: 05/14/2014 Elsevier Interactive Patient Education  2017 [employer].

## 2022-09-25 ENCOUNTER — Other Ambulatory Visit: Payer: Self-pay

## 2022-09-25 MED ORDER — AMPHETAMINE-DEXTROAMPHETAMINE 10 MG PO TABS: 10.0000 mg | ORAL_TABLET | Freq: Two times a day (BID) | ORAL | 0 refills | Status: DC

## 2022-09-25 NOTE — Telephone Encounter (Signed)
Requesting: adderall Contract: 08/09/21 UDS: 03/05/22 Last Visit: 03/05/22, provider appt Next Visit: 6 mo RCI not scheduled Last Refill: 08/23/22 (60,0)  Please Advise. Med pending

## 2022-09-25 NOTE — Telephone Encounter (Signed)
Prescription sent. Patient due for follow up

## 2022-09-25 NOTE — Telephone Encounter (Signed)
Patient refill request.  [facility] [address]  amphetamine-dextroamphetamine (ADDERALL) 10 MG tablet

## 2022-10-19 IMAGING — CR DG CHEST 2V
2 series · 2 of 2 positions shown · non-contrast
Comparison: 05/10/2015

CLINICAL DATA: 69-year-old male with latent TB

EXAM:
CHEST - 2 VIEW

[w chest pa]
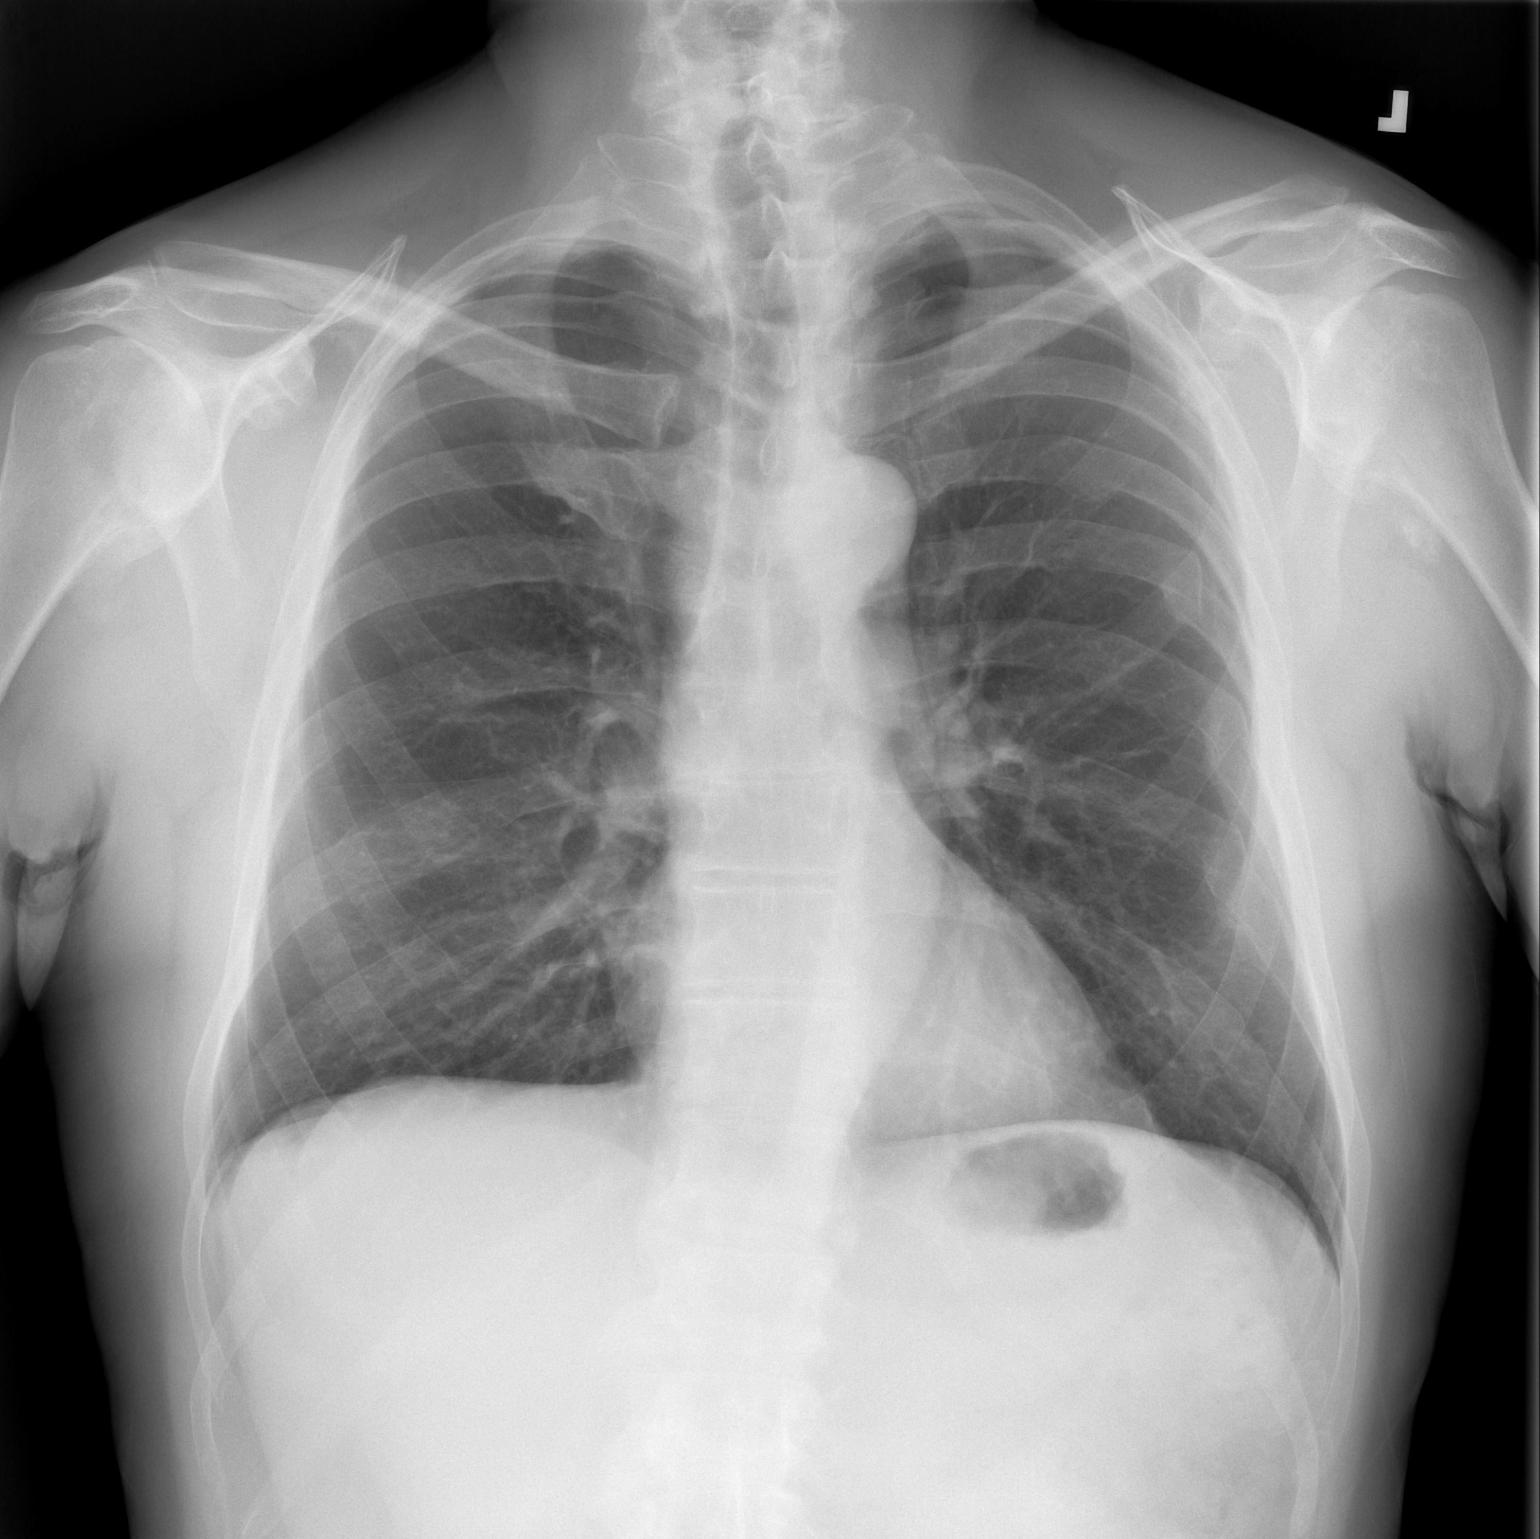

[w chest lat]
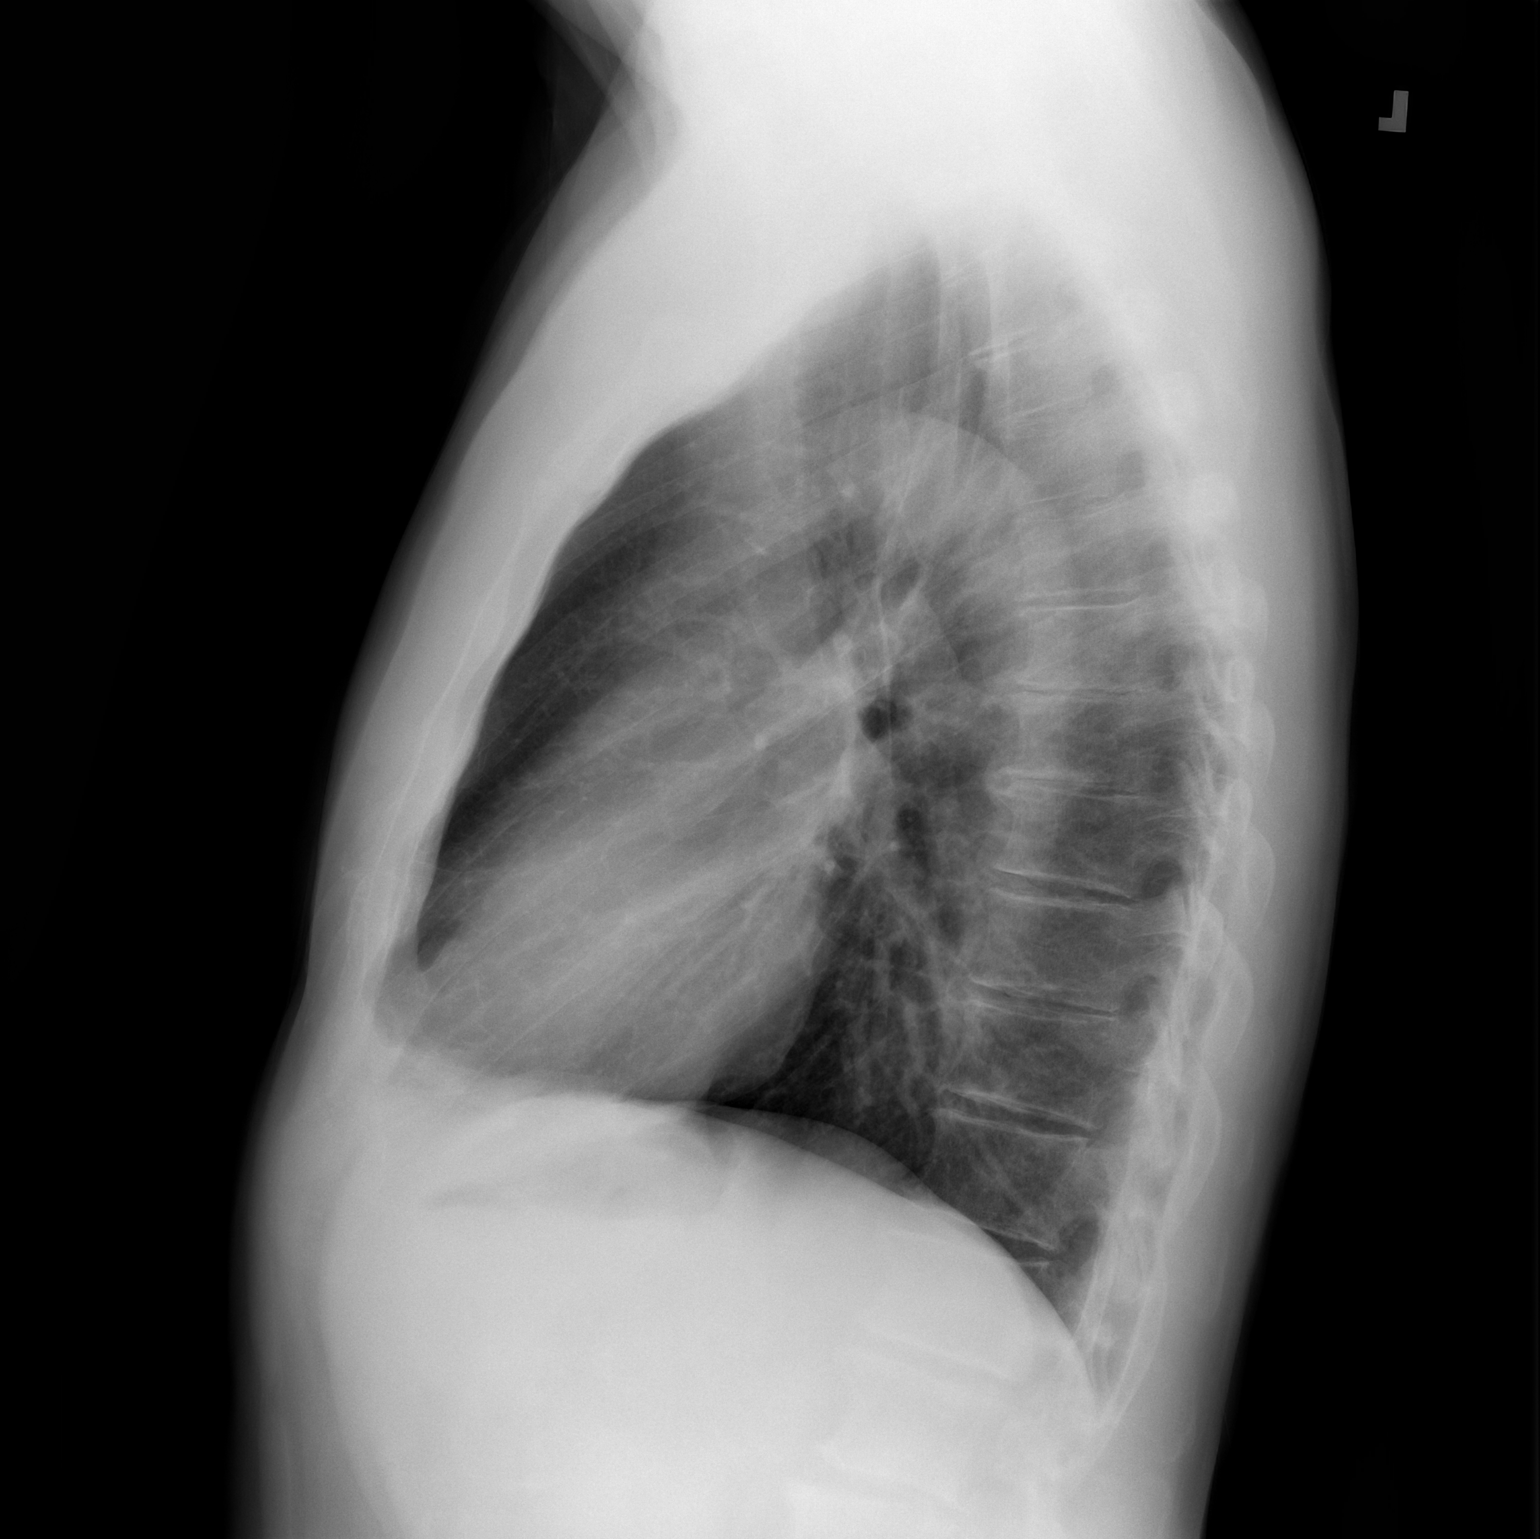

[2 of 2 positions shown; findings below may reference images not displayed]

FINDINGS: Cardiomediastinal silhouette unchanged in size and contour. No
evidence of central vascular congestion. No interlobular septal
thickening.

Interval resolution of the interstitial/airspace opacity at the left
lung base.

No calcified mediastinal lymph nodes.

No pneumothorax or pleural effusion. Coarsened interstitial
markings, with no confluent airspace disease.

No acute displaced fracture. Degenerative changes of the spine.

Left-sided rib deformities are unchanged. Degenerative changes at
the left glenohumeral joint.
IMPRESSION: Negative for acute cardiopulmonary disease.

## 2022-10-26 ENCOUNTER — Other Ambulatory Visit: Payer: Self-pay | Admitting: Family Medicine

## 2022-10-26 NOTE — Telephone Encounter (Signed)
Refill requested for Adderall  Last OV 03/05/22  Next OV was due 09/03/22 none scheduled.

## 2022-10-27 NOTE — Telephone Encounter (Signed)
Adderall rf sent. Needs f/u.

## 2022-11-26 ENCOUNTER — Other Ambulatory Visit: Payer: Self-pay | Admitting: Family Medicine

## 2022-12-31 ENCOUNTER — Other Ambulatory Visit: Payer: Self-pay | Admitting: Family Medicine

## 2022-12-31 NOTE — Telephone Encounter (Signed)
15-day supply sent in. Patient overdue for follow-up.

## 2022-12-31 NOTE — Telephone Encounter (Signed)
Requesting: amphetamine-dextroamphetamine (ADDERALL) 10 MG tablet  Contract: 08/09/21 UDS: 03/05/22 Last Visit: 03/05/22 Next Visit: AWV 09/18/23 Last Refill: 11/26/22 (60,0)  Please Advise. Med pending

## 2023-01-14 ENCOUNTER — Encounter: Payer: Self-pay | Admitting: Family Medicine

## 2023-01-14 ENCOUNTER — Ambulatory Visit (INDEPENDENT_AMBULATORY_CARE_PROVIDER_SITE_OTHER): Payer: Medicare Other | Admitting: Family Medicine

## 2023-01-14 VITALS — BP 131/87 | HR 71 | Wt 164.8 lb

## 2023-01-14 DIAGNOSIS — Z227 Latent tuberculosis: Secondary | ICD-10-CM | POA: Diagnosis not present

## 2023-01-14 DIAGNOSIS — F341 Dysthymic disorder: Secondary | ICD-10-CM | POA: Diagnosis not present

## 2023-01-14 DIAGNOSIS — F988 Other specified behavioral and emotional disorders with onset usually occurring in childhood and adolescence: Secondary | ICD-10-CM

## 2023-01-14 DIAGNOSIS — Q5561 Curvature of penis (lateral): Secondary | ICD-10-CM

## 2023-01-14 DIAGNOSIS — Z23 Encounter for immunization: Secondary | ICD-10-CM

## 2023-01-14 MED ORDER — AMPHETAMINE-DEXTROAMPHETAMINE 10 MG PO TABS: 10.0000 mg | ORAL_TABLET | Freq: Two times a day (BID) | ORAL | 0 refills | Status: DC

## 2023-01-14 NOTE — Progress Notes (Signed)
OFFICE VISIT  01/14/2023  CC:  Chief Complaint  Patient presents with   Adult ADD    F/U. Pt states everything has been okay. No further questions/concerns.     Patient is a 72 y.o. male who presents for f/u adult ADD. A/P as of last visit: "1 dysthymia, worsening lately. He will continue to consider trial of antidepressant.  If we were to do this we both decided today fluoxetine would be a good choice.   #2 adult ADD. Doing well long-term on Adderall 10 mg twice daily.  We will continue this. Controlled substance contract up-to-date. Urine drug screen today. "  INTERIM HX: Clinton Adams is feeling well. No significant depressed mood or overwhelming anxiety. He requests urology referral to further evaluate curvature of the penis.  He states this started a couple of years ago.  He does feel plaque/cord and notes a narrowing of the penis at the curvature.  Pt states all is going well with the med at current dosing (adderall 10mg  bid): much improved focus, concentration, task completion.  Less frustration, better multitasking, less impulsivity and restlessness.  Mood is stable. No side effects from the medication.  PMP AWARE reviewed today: most recent rx for Adderall 10 mg was filled 12/31/2022, # 30, rx by me. No red flags.   Past Medical History:  Diagnosis Date   Adult ADHD    Baker's cyst    R.  Aspiration 07/2021   Cataract    Chronic pain of right knee 11/2018   osteoarthritis + pes anserine bursitis (PT as of 12/2018)   Colon cancer screening 03/2016   2017 Cologuard NEG.  2020 FIT test NEG 12/22/17. FIT POS 12/2019-->colonoscopy normal.   Diverticulosis 04/2020   noted on colonoscopy done for + FIT screening (colonoscopy o/w normal).   Dysthymia    Former smoker    Glenohumeral arthritis, right 2020   advanced as of Dr. Dub Mikes 11/2018 eval.   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  With his positive skin test he was treated for latent TB  with INH but admits he wasn't fully compliant with the med (CXR pending as of 03/24/15).   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   Multiple closed fractures of metatarsal bone of right foot    2nd and 5th metatarsals   Prediabetes    A1c 5.9% 08/2017   Secondary male hypogonadism    clomid 04/2019--helpful   Ventricular bigeminy     Past Surgical History:  Procedure Laterality Date   COLONOSCOPY  05/04/2020   NORMAL   SEPTOPLASTY  2010   deviated septum, per pt    Outpatient Medications Prior to Visit  Medication Sig Dispense Refill   sildenafil (REVATIO) 20 MG tablet 1-2 qd prn intercourse 10 tablet 2   amphetamine-dextroamphetamine (ADDERALL) 10 MG tablet TAKE ONE TABLET BY MOUTH TWICE DAILY 30 tablet 0   No facility-administered medications prior to visit.    No Known Allergies  Review of Systems As per HPI  PE:    01/14/2023   10:02 AM 09/12/2022    2:34 PM 03/05/2022   10:18 AM  Vitals with BMI  Height   5' 9.5"  Weight 164 lbs 13 oz 162 lbs 162 lbs 13 oz  BMI   23.7  Systolic 131  129  Diastolic 87  83  Pulse 71  61     Physical Exam  Gen: Alert, well appearing.  Patient is oriented to person, place, time, and situation. AFFECT: pleasant, lucid thought and speech. No further exam today  LABS:  Last CBC Lab Results  Component Value Date   WBC 6.0 12/24/2019   HGB 13.7 12/24/2019   HCT 41.0 12/24/2019   MCV 94.0 12/24/2019   RDW 12.7 12/24/2019   PLT 207.0 12/24/2019   Last metabolic panel Lab Results  Component Value Date   GLUCOSE 72 12/24/2019   NA 138 12/24/2019   K 4.4 12/24/2019   CL 103 12/24/2019   CO2 30 12/24/2019   BUN 20 12/24/2019   CREATININE 1.20 12/24/2019   GFR 59.85 (L) 12/24/2019   CALCIUM 9.1 12/24/2019   PROT 7.4 08/29/2017   ALBUMIN 3.9 08/29/2017   BILITOT 0.6 08/29/2017   ALKPHOS 43 08/29/2017   AST 18 08/29/2017   ALT 13 08/29/2017    Last hemoglobin A1c Lab Results  Component Value Date   HGBA1C 6.0 12/24/2019   Lab Results  Component Value Date   PSA 1.29 05/04/2019   PSA 1.52 08/29/2017   IMPRESSION AND PLAN:  #1 adult ADD, doing well on Adderall 10 mg twice daily long-term. Urine drug screen up-to-date.  #2 penile curvature. 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 Refer to urology.  #3 latent tuberculosis. He has a history of a positive TB test in the remote past.   He admits he was not completely compliant when he was on INH back in the 1970s.  He does want to get a chest x-ray.  His last chest x-ray was done in 2022.  #4 dysthymia. He seems to be doing a little better lately.  He does not want to try antidepressant at this time.  An After Visit Summary was printed and given to the patient.  FOLLOW UP: Return in about 6 months (around 07/14/2023) for routine chronic illness f/u.  Signed:  Santiago Bumpers, MD           01/14/2023

## 2023-02-15 ENCOUNTER — Other Ambulatory Visit: Payer: Self-pay | Admitting: Family Medicine

## 2023-02-15 NOTE — Telephone Encounter (Signed)
Requesting: adderall 10mg  Contract: 08/09/21 UDS:  03/05/22 Last Visit: 01/14/23 Next Visit: 09/18/23 AWV Last Refill: 01/14/23 (60,0)  Please Advise. Med pending

## 2023-03-19 ENCOUNTER — Other Ambulatory Visit: Payer: Self-pay | Admitting: Family Medicine

## 2023-04-23 ENCOUNTER — Other Ambulatory Visit: Payer: Self-pay | Admitting: Family Medicine

## 2023-05-29 ENCOUNTER — Other Ambulatory Visit: Payer: Self-pay | Admitting: Family Medicine

## 2023-05-29 NOTE — Telephone Encounter (Signed)
 Refill requested for Adderall sent to [facility]. Next OV not due until 07/14/23

## 2023-06-04 DIAGNOSIS — H524 Presbyopia: Secondary | ICD-10-CM | POA: Diagnosis not present

## 2023-07-01 ENCOUNTER — Other Ambulatory Visit: Payer: Self-pay | Admitting: Family Medicine

## 2023-07-01 NOTE — Telephone Encounter (Signed)
Prescription sent. Due for follow up.

## 2023-07-01 NOTE — Telephone Encounter (Signed)
 Pt advised, f/u appt scheduled 3/24

## 2023-07-01 NOTE — Telephone Encounter (Signed)
 Requesting:   amphetamine-dextroamphetamine (ADDERALL) 10 MG tablet   Contract: 08/09/21 UDS: 03/05/22 Last Visit: 01/14/23 Next Visit: advised to f/u 6 mo Last Refill: 05/29/23 (60,0) Please Advise. Med pending

## 2023-07-08 ENCOUNTER — Ambulatory Visit (HOSPITAL_BASED_OUTPATIENT_CLINIC_OR_DEPARTMENT_OTHER)
Admission: RE | Admit: 2023-07-08 | Discharge: 2023-07-08 | Disposition: A | Discharge status: Home / Self Care | Source: Ambulatory Visit | Attending: Family Medicine | Admitting: Family Medicine

## 2023-07-08 DIAGNOSIS — Z227 Latent tuberculosis: Secondary | ICD-10-CM | POA: Insufficient documentation

## 2023-07-08 DIAGNOSIS — S2242XA Multiple fractures of ribs, left side, initial encounter for closed fracture: Secondary | ICD-10-CM | POA: Diagnosis not present

## 2023-07-15 ENCOUNTER — Ambulatory Visit: Admitting: Family Medicine

## 2023-07-29 ENCOUNTER — Telehealth: Payer: Self-pay | Admitting: Family Medicine

## 2023-07-29 ENCOUNTER — Ambulatory Visit (INDEPENDENT_AMBULATORY_CARE_PROVIDER_SITE_OTHER): Admitting: Family Medicine

## 2023-07-29 ENCOUNTER — Encounter: Payer: Self-pay | Admitting: Family Medicine

## 2023-07-29 VITALS — BP 136/80 | HR 59 | Ht 69.5 in | Wt 166.6 lb

## 2023-07-29 DIAGNOSIS — Z79899 Other long term (current) drug therapy: Secondary | ICD-10-CM | POA: Diagnosis not present

## 2023-07-29 DIAGNOSIS — G8929 Other chronic pain: Secondary | ICD-10-CM

## 2023-07-29 DIAGNOSIS — L819 Disorder of pigmentation, unspecified: Secondary | ICD-10-CM | POA: Diagnosis not present

## 2023-07-29 DIAGNOSIS — M19011 Primary osteoarthritis, right shoulder: Secondary | ICD-10-CM

## 2023-07-29 DIAGNOSIS — M19012 Primary osteoarthritis, left shoulder: Secondary | ICD-10-CM | POA: Diagnosis not present

## 2023-07-29 DIAGNOSIS — F988 Other specified behavioral and emotional disorders with onset usually occurring in childhood and adolescence: Secondary | ICD-10-CM | POA: Diagnosis not present

## 2023-07-29 DIAGNOSIS — M25511 Pain in right shoulder: Secondary | ICD-10-CM | POA: Diagnosis not present

## 2023-07-29 DIAGNOSIS — M25512 Pain in left shoulder: Secondary | ICD-10-CM

## 2023-07-29 MED ORDER — HYDROQUINONE 4 % EX EMUL: CUTANEOUS | 5 refills | Status: AC

## 2023-07-29 MED ORDER — IVERMECTIN 3 MG PO TABS: ORAL_TABLET | ORAL | 0 refills | Status: AC

## 2023-07-29 MED ORDER — AMPHETAMINE-DEXTROAMPHETAMINE 10 MG PO TABS: 10.0000 mg | ORAL_TABLET | Freq: Two times a day (BID) | ORAL | 0 refills | Status: DC

## 2023-07-29 NOTE — Progress Notes (Signed)
 OFFICE VISIT  07/29/2023  CC:  Chief Complaint  Patient presents with   Medical Management of Chronic Issues    Patient is a 73 y.o. male who presents for f/u adult ADD. A/P as of last visit: "#1 adult ADD, doing well on Adderall 10 mg twice daily long-term. Urine drug screen up-to-date.   #2 penile curvature. 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 Refer to urology.   #3 latent tuberculosis. He has a history of a positive TB test in the remote past.   He admits he was not completely compliant when he was on INH back in the 1970s.  He does want to get a chest x-ray.  His last chest x-ray was done in 2022.   #4 dysthymia. He seems to be doing a little better lately.  He does not want to try antidepressant at this time."  INTERIM HX:  Pt states all is going well with the med at current dosing (Adderall 10 mg twice daily): much improved focus, concentration, task completion.  Less frustration, better multitasking, less impulsivity and restlessness.  Mood is stable. No side effects from the medication.   Chest x-ray after last visit was normal. Past Medical History:  Diagnosis Date   Adult ADHD    Baker's cyst    R.  Aspiration 07/2021   Cataract    Chronic pain of right knee 11/2018   osteoarthritis + pes anserine bursitis (PT as of 12/2018)   Colon cancer screening 03/2016   2017 Cologuard NEG.  2020 FIT test NEG 12/22/17. FIT POS 12/2019-->colonoscopy normal.   Diverticulosis 04/2020   noted on colonoscopy done for + FIT screening (colonoscopy o/w normal).   Dysthymia    Former smoker    Glenohumeral arthritis, right 2020   advanced as of Dr. Dub Mikes 11/2018 eval.   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  With his positive skin test he was treated for latent TB with INH but admits he wasn't fully compliant with the med (CXR pending as of 03/24/15).   History of rib fracture    Multiple on L side (remote past + 04/2015)   History of traumatic brain  injury    1970s   Multiple closed fractures of metatarsal bone of right foot    2nd and 5th metatarsals   Prediabetes    A1c 5.9% 08/2017   Secondary male hypogonadism    clomid 04/2019--helpful   Ventricular bigeminy     Past Surgical History:  Procedure Laterality Date   COLONOSCOPY  05/04/2020   NORMAL   SEPTOPLASTY  2010   deviated septum, per pt    Outpatient Medications Prior to Visit  Medication Sig Dispense Refill   sildenafil (REVATIO) 20 MG tablet 1-2 qd prn intercourse 10 tablet 2   amphetamine-dextroamphetamine (ADDERALL) 10 MG tablet TAKE ONE TABLET BY MOUTH TWICE DAILY 60 tablet 0   No facility-administered medications prior to visit.    No Known Allergies  Review of Systems As per HPI  PE:    07/29/2023   10:06 AM 01/14/2023   10:02 AM 09/12/2022    2:34 PM  Vitals with BMI  Height 5' 9.5"    Weight 166 lbs 10 oz 164 lbs 13 oz 162 lbs  BMI 24.26    Systolic 136 131   Diastolic 80 87   Pulse 59 71    Physical Exam  Gen: Alert, well appearing.  Patient is oriented to person, place, time, and situation. AFFECT: pleasant, lucid thought  and speech. Skin: He has some small hyperpigmented macules on his face and neck. No further exam today  LABS:  Last CBC Lab Results  Component Value Date   WBC 6.0 12/24/2019   HGB 13.7 12/24/2019   HCT 41.0 12/24/2019   MCV 94.0 12/24/2019   RDW 12.7 12/24/2019   PLT 207.0 12/24/2019   Last metabolic panel Lab Results  Component Value Date   GLUCOSE 72 12/24/2019   NA 138 12/24/2019   K 4.4 12/24/2019   CL 103 12/24/2019   CO2 30 12/24/2019   BUN 20 12/24/2019   CREATININE 1.20 12/24/2019   GFR 59.85 (L) 12/24/2019   CALCIUM 9.1 12/24/2019   PROT 7.4 08/29/2017   ALBUMIN 3.9 08/29/2017   BILITOT 0.6 08/29/2017   ALKPHOS 43 08/29/2017   AST 18 08/29/2017   ALT 13 08/29/2017   Last hemoglobin A1c Lab Results  Component Value Date   HGBA1C 6.0 12/24/2019   IMPRESSION AND PLAN:  #1 adult ADD,  stable on Adderall 10 mg twice daily long-term.  2.  Chronic bilateral shoulder pain.  He does have a history of osteoarthritis in the shoulders.  He requests a trial of one-time dosing of ivermectin for this.  He has done research and knows that it is off label use.  He is aware of the potential side effects of ivermectin. I will go ahead and prescribe 3 mg tabs, take 5 tabs x 1 dose.  #3 focal hyperpigmentation. He requests a trial of Hydroquinone 4% cream to apply bid. Rx sent today.  An After Visit Summary was printed and given to the patient.  FOLLOW UP: Return in about 6 months (around 01/28/2024) for routine chronic illness f/u.  Signed:  Santiago Bumpers, MD           07/29/2023

## 2023-07-29 NOTE — Telephone Encounter (Signed)
 Please review and advise.

## 2023-07-29 NOTE — Telephone Encounter (Signed)
Okay to change to cream.

## 2023-07-29 NOTE — Telephone Encounter (Unsigned)
 Copied from CRM #[id number]. Topic: Clinical - Prescription Issue >> Jul 29, 2023  3:28 PM Gurney Maxin H wrote: Reason for CRM: Duwayne Heck calling in regards to the prescription they received for the Hydroquinone 4 % EMUL, states they only have the cream not the EMUL, please reach out, thanks.  Danielle [phone] [facility]

## 2023-07-30 NOTE — Telephone Encounter (Signed)
 Left detailed message on pharmacy voicemail regarding provider approval for med change.

## 2023-07-31 ENCOUNTER — Telehealth: Payer: Self-pay

## 2023-07-31 ENCOUNTER — Other Ambulatory Visit (HOSPITAL_COMMUNITY): Payer: Self-pay

## 2023-07-31 LAB — DM TEMPLATE

## 2023-07-31 NOTE — Telephone Encounter (Signed)
 FYI  Please see below

## 2023-07-31 NOTE — Telephone Encounter (Signed)
 Pharmacy Patient Advocate Encounter  Received notification from [facility] that Prior Authorization for Ivermectin 3MG  tablets has been DENIED.  See denial reason below. No denial letter attached in CMM. Will attach denial letter to Media tab once received.   PA #/Case ID/Reference #: [id number]

## 2023-07-31 NOTE — Telephone Encounter (Signed)
 Pharmacy Patient Advocate Encounter   Received notification from CoverMyMeds that prior authorization for Ivermectin 3MG  tablets is required/requested.   Insurance verification completed.   The patient is insured through [employer] .   Per test claim: PA required; PA submitted to above mentioned insurance via CoverMyMeds Key/confirmation #/EOC [id number] Status is pending

## 2023-08-01 LAB — DRUG MONITORING PANEL 376104, URINE
Barbiturates: NEGATIVE ng/mL (ref <300)
Benzodiazepines: NEGATIVE ng/mL (ref <100)
Cocaine Metabolite: NEGATIVE ng/mL (ref <150)
Desmethyltramadol: NEGATIVE ng/mL (ref <100)
Methamphetamine: NEGATIVE ng/mL (ref <250)
Opiates: NEGATIVE ng/mL (ref <100)
Oxycodone: NEGATIVE ng/mL (ref <100)
Tramadol Comments: 2364 ng/mL — ABNORMAL HIGH (ref <250)
Tramadol: POSITIVE ng/mL — AB (ref <500)
Tramadol: NEGATIVE ng/mL (ref <100)
medMATCH Summary: NEGATIVE ng/mL (ref <100)

## 2023-08-01 LAB — DM TEMPLATE

## 2023-08-01 NOTE — Telephone Encounter (Signed)
 Patient was made aware. He will contact the pharmacy regarding cost

## 2023-08-01 NOTE — Telephone Encounter (Signed)
Please make sure patient is aware

## 2023-09-02 ENCOUNTER — Other Ambulatory Visit: Payer: Self-pay | Admitting: Family Medicine

## 2023-09-18 ENCOUNTER — Ambulatory Visit (INDEPENDENT_AMBULATORY_CARE_PROVIDER_SITE_OTHER): Payer: Medicare Other | Admitting: *Deleted

## 2023-09-18 DIAGNOSIS — Z0000 Encounter for general adult medical examination without abnormal findings: Secondary | ICD-10-CM | POA: Diagnosis not present

## 2023-09-18 NOTE — Progress Notes (Signed)
 Subjective:   Clinton Adams is a 73 y.o. male who presents for Medicare Annual/Subsequent preventive examination.  Visit Complete: Virtual I connected with  Clinton Adams on 09/18/23 by a audio enabled telemedicine application and verified that I am speaking with the correct person using two identifiers.  Patient Location: Home  Provider Location: Home Office  I discussed the limitations of evaluation and management by telemedicine. The patient expressed understanding and agreed to proceed.  Vital Signs: Because this visit was a virtual/telehealth visit, some criteria may be missing or patient reported. Any vitals not documented were not able to be obtained and vitals that have been documented are patient reported.   Cardiac Risk Factors include: advanced age (>10men, >57 women);male gender;family history of premature cardiovascular disease     Objective:     Today's Vitals   09/18/23 1313  PainSc: 3    There is no height or weight on file to calculate BMI.     09/18/2023    1:18 PM 09/12/2022    2:38 PM 08/09/2021    8:55 AM 08/03/2020    3:53 PM 09/09/2017    3:24 PM 03/30/2016    1:26 PM  Advanced Directives  Does Patient Have a Medical Advance Directive? No No Yes No No No  Type of Economist of Healthcare Power of Attorney in Chart?   No - copy requested     Would patient like information on creating a medical advance directive? No - Patient declined No - Patient declined  No - Patient declined No - Patient declined No - Patient declined    Current Medications (verified) Outpatient Encounter Medications as of 09/18/2023  Medication Sig   amphetamine -dextroamphetamine  (ADDERALL) 10 MG tablet Take 1 tablet (10 mg total) by mouth 2 (two) times daily   Hydroquinone  4 % EMUL Apply bid to affected areas   ivermectin  (STROMECTOL ) 3 MG TABS tablet 5 tabs po x 1 dose   sildenafil  (REVATIO ) 20 MG tablet 1-2 qd prn intercourse    No facility-administered encounter medications on file as of 09/18/2023.    Allergies (verified) Patient has no known allergies.   History: Past Medical History:  Diagnosis Date   Adult ADHD    Baker's cyst    R.  Aspiration 07/2021   Cataract    Chronic pain of right knee 11/2018   osteoarthritis + pes anserine bursitis (PT as of 12/2018)   Colon cancer screening 03/2016   2017 Cologuard NEG.  2020 FIT test NEG 12/22/17. FIT POS 12/2019-->colonoscopy normal.   Diverticulosis 04/2020   noted on colonoscopy done for + FIT screening (colonoscopy o/w normal).   Dysthymia    Former smoker    Glenohumeral arthritis, right 2020   advanced as of Dr. Leandro Adams 11/2018 eval.   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  With his positive skin test he was treated for latent TB with INH but admits he wasn't fully compliant with the med (CXR pending as of 03/24/15).   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   Multiple closed fractures of metatarsal bone of right foot    2nd and 5th metatarsals   Prediabetes    A1c 5.9% 08/2017   Secondary male hypogonadism    clomid  04/2019--helpful   Ventricular bigeminy  Past Surgical History:  Procedure Laterality Date   COLONOSCOPY  05/04/2020   NORMAL   SEPTOPLASTY  2010   deviated septum, per pt   Family History  Problem Relation Age of Onset   Hypertension Mother    Lung cancer Father    Heart disease Father    Colon cancer Neg Hx    Esophageal cancer Neg Hx    Rectal cancer Neg Hx    Stomach cancer Neg Hx    Social History   Socioeconomic History   Marital status: Married    Spouse name: Not on file   Number of children: Not on file   Years of education: Not on file   Highest education level: Not on file  Occupational History   Not on file  Tobacco Use   Smoking status: Former    Current packs/day: 0.00    Average  packs/day: 1 pack/day for 30.0 years (30.0 ttl pk-yrs)    Types: Cigarettes    Start date: 04/23/1964    Quit date: 04/23/1994    Years since quitting: 29.4   Smokeless tobacco: Never  Vaping Use   Vaping status: Never Used  Substance and Sexual Activity   Alcohol use: Yes    Comment: daily   Drug use: No   Sexual activity: Yes  Other Topics Concern   Not on file  Social History Narrative   Married, one daughter.  No grandchilden.   Occup: Retired PA, CV surg/trauma surg/ortho, some FP.   Owns [employer] in [place], [place].   Former smoker; 30 pack-yr hx, quit age 1.  Alc:  1-2 hard liquor drinks a day.   Exercise: bicycle   Enjoys motorcycles, used to ride [place], has a triumph.   Social Drivers of Corporate investment banker Strain: Low Risk  (09/18/2023)   Overall Financial Resource Strain (CARDIA)    Difficulty of Paying Living Expenses: Not hard at all  Food Insecurity: No Food Insecurity (09/18/2023)   Hunger Vital Sign    Worried About Running Out of Food in the Last Year: Never true    Ran Out of Food in the Last Year: Never true  Transportation Needs: No Transportation Needs (09/18/2023)   PRAPARE - Administrator, Civil Service (Medical): No    Lack of Transportation (Non-Medical): No  Physical Activity: Insufficiently Active (09/18/2023)   Exercise Vital Sign    Days of Exercise per Week: 3 days    Minutes of Exercise per Session: 30 min  Stress: No Stress Concern Present (09/18/2023)   [employer] of Occupational Health - Occupational Stress Questionnaire    Feeling of Stress : Only a little  Social Connections: Moderately Isolated (09/18/2023)   Social Connection and Isolation Panel [NHANES]    Frequency of Communication with Friends and Family: Three times a week    Frequency of Social Gatherings with Friends and Family: Three times a week    Attends Religious Services: Never    Active Member of Clubs or Organizations: No    Attends Tax inspector Meetings: Never    Marital Status: Married    Tobacco Counseling Counseling given: Not Answered   Clinical Intake:  Pre-visit preparation completed: Yes  Pain : 0-10 Pain Score: 3  Pain Type: Chronic pain Pain Location: Shoulder Pain Descriptors / Indicators: Aching, Burning Pain Onset: More than a month ago Pain Frequency: Constant     Diabetes: No  How often do you need to have someone  help you when you read instructions, pamphlets, or other written materials from your doctor or pharmacy?: 1 - Never  Interpreter Needed?: No  Information entered by :: Kieth Pelt LPN   Activities of Daily Living    09/18/2023    1:22 PM  In your present state of health, do you have any difficulty performing the following activities:  Hearing? 0  Vision? 0  Difficulty concentrating or making decisions? 0  Walking or climbing stairs? 0  Dressing or bathing? 0  Doing errands, shopping? 0  Preparing Food and eating ? N  Using the Toilet? N  In the past six months, have you accidently leaked urine? N  Do you have problems with loss of bowel control? N  Managing your Medications? N  Managing your Finances? N  Housekeeping or managing your Housekeeping? N    Patient Care Team: Shelvia Dick, MD as PCP - General (Family Medicine) Regal, Angus Kenning, DPM as Consulting Physician (Podiatry) Cleda Curly as Physician Assistant (Physician Assistant) Ellard Gunning, MD as Consulting Physician (Orthopedic Surgery) Liliane Rei, MD as Consulting Physician (Orthopedic Surgery) Dominic Friendly Cordelia Dessert, MD as Consulting Physician (Gastroenterology)  Indicate any recent Medical Services you may have received from other than [facility] providers in the past year (date may be approximate).     Assessment:    This is a routine wellness examination for Foster.  Hearing/Vision screen Hearing Screening - Comments:: No trouble hearing Vision Screening - Comments:: Up to  date [facility]   Goals Addressed             This Visit's Progress    Patient Stated   On track    Maintain current health.      Patient Stated   On track    None at this time      Patient Stated   On track    Live long     Patient Stated   On track    Stay healthy       Depression Screen    09/18/2023    1:20 PM 07/29/2023   10:10 AM 09/12/2022    2:37 PM 08/09/2021    8:54 AM 06/26/2021   10:46 AM 08/03/2020    3:54 PM 06/23/2020    2:39 PM  PHQ 2/9 Scores  PHQ - 2 Score 2 2 0 0 0 0 0  PHQ- 9 Score 6 5         Fall Risk    09/18/2023    1:13 PM 09/12/2022    2:38 PM 08/09/2021    8:56 AM 06/26/2021   10:46 AM 08/03/2020    3:53 PM  Fall Risk   Falls in the past year? 0 0 0 0 0  Number falls in past yr: 0 0 0 0 0  Injury with Fall? 0 0 0 0 0  Risk for fall due to :  Impaired vision Impaired vision    Follow up Falls evaluation completed;Education provided;Falls prevention discussed Falls prevention discussed Falls prevention discussed Falls evaluation completed Falls prevention discussed    MEDICARE RISK AT HOME: Medicare Risk at Home Any stairs in or around the home?: Yes If so, are there any without handrails?: No Home free of loose throw rugs in walkways, pet beds, electrical cords, etc?: Yes Adequate lighting in your home to reduce risk of falls?: Yes Life alert?: No Use of a cane, walker or w/c?: No Grab bars in the bathroom?: No Shower chair or bench in  shower?: No Elevated toilet seat or a handicapped toilet?: No  TIMED UP AND GO:  Was the test performed?  No    Cognitive Function:        09/18/2023    1:17 PM 09/12/2022    2:39 PM 08/09/2021    8:57 AM  6CIT Screen  What Year? 0 points 0 points 0 points  What month? 0 points 0 points 0 points  What time? 0 points 0 points 0 points  Count back from 20 0 points 0 points 0 points  Months in reverse 0 points 0 points 0 points  Repeat phrase 0 points 0 points 0 points  Total Score 0 points 0 points  0 points    Immunizations Immunization History  Administered Date(s) Administered   Fluad Quad(high Dose 65+) 03/05/2022   Fluad Trivalent(High Dose 65+) 01/14/2023   Influenza, High Dose Seasonal PF 03/25/2017, 01/27/2018, 01/21/2019   Influenza-Unspecified 03/08/2016    TDAP status: Due, Education has been provided regarding the importance of this vaccine. Advised may receive this vaccine at local pharmacy or Health Dept. Aware to provide a copy of the vaccination record if obtained from local pharmacy or Health Dept. Verbalized acceptance and understanding.  Flu Vaccine status: Up to date  Pneumococcal vaccine status: Due, Education has been provided regarding the importance of this vaccine. Advised may receive this vaccine at local pharmacy or Health Dept. Aware to provide a copy of the vaccination record if obtained from local pharmacy or Health Dept. Verbalized acceptance and understanding.  Covid-19 vaccine status: Declined, Education has been provided regarding the importance of this vaccine but patient still declined. Advised may receive this vaccine at local pharmacy or Health Dept.or vaccine clinic. Aware to provide a copy of the vaccination record if obtained from local pharmacy or Health Dept. Verbalized acceptance and understanding.  Qualifies for Shingles Vaccine? Yes   Zostavax completed No   Shingrix Completed?: No.    Education has been provided regarding the importance of this vaccine. Patient has been advised to call insurance company to determine out of pocket expense if they have not yet received this vaccine. Advised may also receive vaccine at local pharmacy or Health Dept. Verbalized acceptance and understanding.  Screening Tests Health Maintenance  Topic Date Due   DTaP/Tdap/Td (1 - Tdap) Never done   COLON CANCER SCREENING ANNUAL FOBT  01/04/2021   COVID-19 Vaccine (1) 10/04/2023 (Originally 12/16/1955)   Zoster Vaccines- Shingrix (1 of 2) 12/19/2023  (Originally [birth year]/1968)   Pneumonia Vaccine 63+ Years old (1 of 1 - PCV) 09/17/2024 (Originally 12/16/1999)   Colonoscopy  09/17/2024 (Originally 05/05/2023)   INFLUENZA VACCINE  11/22/2023   Medicare Annual Wellness (AWV)  09/17/2024   Hepatitis C Screening  Completed   HPV VACCINES  Aged Out   Meningococcal B Vaccine  Aged Out    Health Maintenance  Health Maintenance Due  Topic Date Due   DTaP/Tdap/Td (1 - Tdap) Never done   COLON CANCER SCREENING ANNUAL FOBT  01/04/2021    Colorectal cancer screening: No longer required.   Lung Cancer Screening: (Low Dose CT Chest recommended if Age 75-80 years, 20 pack-year currently smoking OR have quit w/in 15years.) does not qualify.   Lung Cancer Screening Referral:   Additional Screening:  Hepatitis C Screening: does not qualify; Completed 2023  Vision Screening: Recommended annual ophthalmology exams for early detection of glaucoma and other disorders of the eye. Is the patient up to date with their annual eye exam?  No  Who is the provider or what is the name of the office in which the patient attends annual eye exams? [facility] If pt is not established with a provider, would they like to be referred to a provider to establish care? No .   Dental Screening: Recommended annual dental exams for proper oral hygiene    Community Resource Referral / Chronic Care Management: CRR required this visit?  No   CCM required this visit?  No     Plan:     I have personally reviewed and noted the following in the patient's chart:   Medical and social history Use of alcohol, tobacco or illicit drugs  Current medications and supplements including opioid prescriptions. Patient is not currently taking opioid prescriptions. Functional ability and status Nutritional status Physical activity Advanced directives List of other physicians Hospitalizations, surgeries, and ER visits in previous 12 months Vitals Screenings to include cognitive,  depression, and falls Referrals and appointments  In addition, I have reviewed and discussed with patient certain preventive protocols, quality metrics, and best practice recommendations. A written personalized care plan for preventive services as well as general preventive health recommendations were provided to patient.     Kieth Pelt, LPN   1/61/0960   After Visit Summary: (MyChart) Due to this being a telephonic visit, the after visit summary with patients personalized plan was offered to patient via MyChart   Nurse Notes:

## 2023-09-18 NOTE — Patient Instructions (Signed)
 Clinton Adams , Thank you for taking time to come for your Medicare Wellness Visit. I appreciate your ongoing commitment to your health goals. Please review the following plan we discussed and let me know if I can assist you in the future.   Screening recommendations/referrals: Colonoscopy:  Recommended yearly ophthalmology/optometry visit for glaucoma screening and checkup Recommended yearly dental visit for hygiene and checkup  Vaccinations: Influenza vaccine: up to date Pneumococcal vaccine:  Tdap vaccine: Education provided Shingles vaccine: Education provided    Preventive Care 74 Years and Older, Male Preventive care refers to lifestyle choices and visits with your health care provider that can promote health and wellness. What does preventive care include? A yearly physical exam. This is also called an annual well check. Dental exams once or twice a year. Routine eye exams. Ask your health care provider how often you should have your eyes checked. Personal lifestyle choices, including: Daily care of your teeth and gums. Regular physical activity. Eating a healthy diet. Avoiding tobacco and drug use. Limiting alcohol use. Practicing safe sex. Taking low doses of aspirin every day. Taking vitamin and mineral supplements as recommended by your health care provider. What happens during an annual well check? The services and screenings done by your health care provider during your annual well check will depend on your age, overall health, lifestyle risk factors, and family history of disease. Counseling  Your health care provider may ask you questions about your: Alcohol use. Tobacco use. Drug use. Emotional well-being. Home and relationship well-being. Sexual activity. Eating habits. History of falls. Memory and ability to understand (cognition). Work and work Astronomer. Screening  You may have the following tests or measurements: Height, weight, and BMI. Blood  pressure. Lipid and cholesterol levels. These may be checked every 5 years, or more frequently if you are over 24 years old. Skin check. Lung cancer screening. You may have this screening every year starting at age 22 if you have a 30-pack-year history of smoking and currently smoke or have quit within the past 15 years. Fecal occult blood test (FOBT) of the stool. You may have this test every year starting at age 29. Flexible sigmoidoscopy or colonoscopy. You may have a sigmoidoscopy every 5 years or a colonoscopy every 10 years starting at age 40. Prostate cancer screening. Recommendations will vary depending on your family history and other risks. Hepatitis C blood test. Hepatitis B blood test. Sexually transmitted disease (STD) testing. Diabetes screening. This is done by checking your blood sugar (glucose) after you have not eaten for a while (fasting). You may have this done every 1-3 years. Abdominal aortic aneurysm (AAA) screening. You may need this if you are a current or former smoker. Osteoporosis. You may be screened starting at age 86 if you are at high risk. Talk with your health care provider about your test results, treatment options, and if necessary, the need for more tests. Vaccines  Your health care provider may recommend certain vaccines, such as: Influenza vaccine. This is recommended every year. Tetanus, diphtheria, and acellular pertussis (Tdap, Td) vaccine. You may need a Td booster every 10 years. Zoster vaccine. You may need this after age 1. Pneumococcal 13-valent conjugate (PCV13) vaccine. One dose is recommended after age 29. Pneumococcal polysaccharide (PPSV23) vaccine. One dose is recommended after age 24. Talk to your health care provider about which screenings and vaccines you need and how often you need them. This information is not intended to replace advice given to you by your  health care provider. Make sure you discuss any questions you have with your  health care provider. Document Released: 05/06/2015 Document Revised: 12/28/2015 Document Reviewed: 02/08/2015 Elsevier Interactive Patient Education  2017 [employer].  Fall Prevention in the Home Falls can cause injuries. They can happen to people of all ages. There are many things you can do to make your home safe and to help prevent falls. What can I do on the outside of my home? Regularly fix the edges of walkways and driveways and fix any cracks. Remove anything that might make you trip as you walk through a door, such as a raised step or threshold. Trim any bushes or trees on the path to your home. Use bright outdoor lighting. Clear any walking paths of anything that might make someone trip, such as rocks or tools. Regularly check to see if handrails are loose or broken. Make sure that both sides of any steps have handrails. Any raised decks and porches should have guardrails on the edges. Have any leaves, snow, or ice cleared regularly. Use sand or salt on walking paths during winter. Clean up any spills in your garage right away. This includes oil or grease spills. What can I do in the bathroom? Use night lights. Install grab bars by the toilet and in the tub and shower. Do not use towel bars as grab bars. Use non-skid mats or decals in the tub or shower. If you need to sit down in the shower, use a plastic, non-slip stool. Keep the floor dry. Clean up any water that spills on the floor as soon as it happens. Remove soap buildup in the tub or shower regularly. Attach bath mats securely with double-sided non-slip rug tape. Do not have throw rugs and other things on the floor that can make you trip. What can I do in the bedroom? Use night lights. Make sure that you have a light by your bed that is easy to reach. Do not use any sheets or blankets that are too big for your bed. They should not hang down onto the floor. Have a firm chair that has side arms. You can use this for  support while you get dressed. Do not have throw rugs and other things on the floor that can make you trip. What can I do in the kitchen? Clean up any spills right away. Avoid walking on wet floors. Keep items that you use a lot in easy-to-reach places. If you need to reach something above you, use a strong step stool that has a grab bar. Keep electrical cords out of the way. Do not use floor polish or wax that makes floors slippery. If you must use wax, use non-skid floor wax. Do not have throw rugs and other things on the floor that can make you trip. What can I do with my stairs? Do not leave any items on the stairs. Make sure that there are handrails on both sides of the stairs and use them. Fix handrails that are broken or loose. Make sure that handrails are as long as the stairways. Check any carpeting to make sure that it is firmly attached to the stairs. Fix any carpet that is loose or worn. Avoid having throw rugs at the top or bottom of the stairs. If you do have throw rugs, attach them to the floor with carpet tape. Make sure that you have a light switch at the top of the stairs and the bottom of the stairs. If you do  not have them, ask someone to add them for you. What else can I do to help prevent falls? Wear shoes that: Do not have high heels. Have rubber bottoms. Are comfortable and fit you well. Are closed at the toe. Do not wear sandals. If you use a stepladder: Make sure that it is fully opened. Do not climb a closed stepladder. Make sure that both sides of the stepladder are locked into place. Ask someone to hold it for you, if possible. Clearly mark and make sure that you can see: Any grab bars or handrails. First and last steps. Where the edge of each step is. Use tools that help you move around (mobility aids) if they are needed. These include: Canes. Walkers. Scooters. Crutches. Turn on the lights when you go into a dark area. Replace any light bulbs as soon  as they burn out. Set up your furniture so you have a clear path. Avoid moving your furniture around. If any of your floors are uneven, fix them. If there are any pets around you, be aware of where they are. Review your medicines with your doctor. Some medicines can make you feel dizzy. This can increase your chance of falling. Ask your doctor what other things that you can do to help prevent falls. This information is not intended to replace advice given to you by your health care provider. Make sure you discuss any questions you have with your health care provider. Document Released: 02/03/2009 Document Revised: 09/15/2015 Document Reviewed: 05/14/2014 Elsevier Interactive Patient Education  2017 [employer].

## 2023-10-01 ENCOUNTER — Other Ambulatory Visit: Payer: Self-pay | Admitting: Family Medicine

## 2023-10-01 NOTE — Telephone Encounter (Signed)
 Requesting: adderall Contract: 07/29/23 UDS: 07/29/23 Last Visit: 07/29/23 Next Visit: advised to f/u 6 months Last Refill: 09/03/23 (60,0)  Please Advise. Rx pending

## 2023-11-08 ENCOUNTER — Other Ambulatory Visit: Payer: Self-pay | Admitting: Family Medicine

## 2023-11-08 NOTE — Telephone Encounter (Signed)
 Requesting: adderall Contract: 07/29/23 UDS: 07/29/23 Last Visit: 07/29/23 Next Visit: advised to f/u 6 months Last Refill: 10/01/23 (60,0)  Please Advise. Rx pending

## 2023-12-06 ENCOUNTER — Other Ambulatory Visit: Payer: Self-pay | Admitting: Family Medicine

## 2023-12-06 NOTE — Telephone Encounter (Signed)
 Refill requested for Adderall sent to [facility] on [address] Last OV 4/7 Next OV due 10/7

## 2024-01-08 ENCOUNTER — Other Ambulatory Visit: Payer: Self-pay | Admitting: Family Medicine

## 2024-01-08 NOTE — Telephone Encounter (Signed)
 Refill requested for Adderall 10mg  sent to [facility] on [address].  Last OV 4/7 Next OV due 10/7

## 2024-02-13 ENCOUNTER — Other Ambulatory Visit: Payer: Self-pay | Admitting: Family Medicine

## 2024-02-13 NOTE — Telephone Encounter (Signed)
 Requesting: adderall Contract: 07/29/23 UDS: 07/29/23 Last Visit: 07/29/23 Next Visit: 02/26/24 Last Refill: 01/08/24 (60,0)  Please Advise. Rx pending

## 2024-02-26 ENCOUNTER — Ambulatory Visit (INDEPENDENT_AMBULATORY_CARE_PROVIDER_SITE_OTHER): Admitting: Family Medicine

## 2024-02-26 ENCOUNTER — Encounter: Payer: Self-pay | Admitting: Family Medicine

## 2024-02-26 VITALS — BP 113/71 | HR 64 | Temp 98.3°F | Ht 69.5 in | Wt 162.0 lb

## 2024-02-26 DIAGNOSIS — Z23 Encounter for immunization: Secondary | ICD-10-CM | POA: Diagnosis not present

## 2024-02-26 DIAGNOSIS — F988 Other specified behavioral and emotional disorders with onset usually occurring in childhood and adolescence: Secondary | ICD-10-CM | POA: Diagnosis not present

## 2024-02-26 NOTE — Progress Notes (Signed)
 OFFICE VISIT  02/26/2024  CC:  Chief Complaint  Patient presents with   Medical Management of Chronic Issues    Patient is a 73 y.o. male who presents for 19-month follow-up adult ADD. He has done well long-term on Adderall 10 mg twice a day.  INTERIM HX: Clinton Adams is doing well. He went to [place] with his brother, says he wants to go back and do some biking there.  He just recently started biking again for exercise.  Says the Baker's cyst in his right knee is back and he is interested in coming back to get it drained.  Pt states all is going well with the med at current dosing (Adderall 10 mg twice daily): much improved focus, concentration, task completion.  Less frustration, better multitasking, less impulsivity and restlessness.  Mood is stable. No side effects from the medication.  PMP AWARE reviewed today: most recent rx for Adderall 10 mg was filled 02/13/2024, # 60, rx by me. No red flags.  He recently got established with the [facility], saw a primary care doctor and got some labs. Additionally, he has chronic bilateral shoulder pain and right knee pain--> they have ordered MRI of both shoulders and of right knee (he describes impingement symptoms in both shoulders and pain in the medial aspect of the knee ( .  Past Medical History:  Diagnosis Date   Adult ADHD    Baker's cyst    R.  Aspiration 07/2021   Cataract    Chronic pain of right knee 11/2018   osteoarthritis + pes anserine bursitis (PT as of 12/2018)   Colon cancer screening 03/2016   2017 Cologuard NEG.  2020 FIT test NEG 12/22/17. FIT POS 12/2019-->colonoscopy normal.   Diverticulosis 04/2020   noted on colonoscopy done for + FIT screening (colonoscopy o/w normal).   Dysthymia    Former smoker    Glenohumeral arthritis, right 2020   advanced as of Dr. Rosea 11/2018 eval.   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  With his positive skin test he was treated for latent TB with INH but  admits he wasn't fully compliant with the med (CXR pending as of 03/24/15).   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   Multiple closed fractures of metatarsal bone of right foot    2nd and 5th metatarsals   Prediabetes    A1c 5.9% 08/2017   Secondary male hypogonadism    clomid  04/2019--helpful   Ventricular bigeminy     Past Surgical History:  Procedure Laterality Date   COLONOSCOPY  05/04/2020   NORMAL   SEPTOPLASTY  2010   deviated septum, per pt    Outpatient Medications Prior to Visit  Medication Sig Dispense Refill   amphetamine -dextroamphetamine  (ADDERALL) 10 MG tablet TAKE ONE TABLET BY MOUTH TWICE DAILY 60 tablet 0   Hydroquinone  4 % EMUL Apply bid to affected areas 30 g 5   ivermectin  (STROMECTOL ) 3 MG TABS tablet 5 tabs po x 1 dose 5 tablet 0   sildenafil  (REVATIO ) 20 MG tablet 1-2 qd prn intercourse 10 tablet 2   No facility-administered medications prior to visit.    No Known Allergies  Review of Systems As per HPI  PE:    02/26/2024    2:36 PM 07/29/2023   10:06 AM 01/14/2023   10:02 AM  Vitals with BMI  Height  5' 9.5 5' 9.5   Weight 162 lbs 166 lbs 10 oz 164 lbs 13 oz  BMI 23.59 24.26   Systolic 113 136 868  Diastolic 71 80 87  Pulse 64 59 71     Physical Exam  Gen: Alert, well appearing.  Patient is oriented to person, place, time, and situation. AFFECT: pleasant, lucid thought and speech. CV: RRR, no m/r/g.   LUNGS: CTA bilat, nonlabored resps, good aeration in all lung fields.   LABS:  Last CBC Lab Results  Component Value Date   WBC 6.0 12/24/2019   HGB 13.7 12/24/2019   HCT 41.0 12/24/2019   MCV 94.0 12/24/2019   RDW 12.7 12/24/2019   PLT 207.0 12/24/2019   Last metabolic panel Lab Results  Component Value Date   GLUCOSE 72 12/24/2019   NA 138 12/24/2019   K 4.4 12/24/2019   CL 103 12/24/2019   CO2 30 12/24/2019   BUN 20 12/24/2019   CREATININE  1.20 12/24/2019   GFR 59.85 (L) 12/24/2019   CALCIUM 9.1 12/24/2019   PROT 7.4 08/29/2017   ALBUMIN 3.9 08/29/2017   BILITOT 0.6 08/29/2017   ALKPHOS 43 08/29/2017   AST 18 08/29/2017   ALT 13 08/29/2017   Last hemoglobin A1c Lab Results  Component Value Date   HGBA1C 6.0 12/24/2019   IMPRESSION AND PLAN:  #1 adult ADD. Stable long-term on Adderall 10 mg twice a day. Controlled substance contract and urine drug screen are up-to-date. A refill was not needed today.  2.  Baker's cyst right knee. He will make an appointment to return and get this aspirated.  #3 bilateral shoulder pain and right knee pain.  He is getting MRI evaluation of all 3 of these locations through the [facility]. He will let me know if there is anything I can help with afterward.   Vaccines: flu-->given today.  Shingrix--> declines.  Tdap--> declines.   An After Visit Summary was printed and given to the patient.  FOLLOW UP: Return in about 6 months (around 08/25/2024) for routine chronic illness f/u.  Signed:  Gerlene Hockey, MD           02/26/2024

## 2024-03-21 ENCOUNTER — Other Ambulatory Visit: Payer: Self-pay | Admitting: Family Medicine

## 2024-03-23 NOTE — Telephone Encounter (Signed)
 Requesting: adderall  Contract: 07/29/23 UDS: 07/29/23 Last Visit: 02/26/24 Next Visit: AWV 09/23/24 Last Refill: 02/13/24 (60,0)  Please Advise. Rx pending

## 2024-04-27 ENCOUNTER — Other Ambulatory Visit: Payer: Self-pay | Admitting: Family Medicine

## 2024-04-27 NOTE — Telephone Encounter (Signed)
 Requesting: adderall  Contract: 07/29/23 UDS: 07/29/23 Last Visit: 02/26/24 Next Visit: 09/23/24 Last Refill: 03/23/24 (60,0)  Please Advise. Rx pending

## 2024-09-23 ENCOUNTER — Encounter
